# Patient Record
Sex: Female | Born: 1980 | Race: White | Marital: Single | Smoking: Never smoker
Health system: Southern US, Community
[De-identification: ages and names within clinical notes are randomized; demographics above are authoritative.]

## PROBLEM LIST (undated history)

## (undated) DIAGNOSIS — F419 Anxiety disorder, unspecified: Secondary | ICD-10-CM

## (undated) DIAGNOSIS — C73 Malignant neoplasm of thyroid gland: Secondary | ICD-10-CM

## (undated) DIAGNOSIS — F909 Attention-deficit hyperactivity disorder, unspecified type: Secondary | ICD-10-CM

## (undated) DIAGNOSIS — A6920 Lyme disease, unspecified: Secondary | ICD-10-CM

## (undated) DIAGNOSIS — F32A Depression, unspecified: Secondary | ICD-10-CM

## (undated) HISTORY — PX: THYROIDECTOMY: SHX17

## (undated) HISTORY — DX: Lyme disease, unspecified: A69.20

## (undated) HISTORY — DX: Anxiety disorder, unspecified: F41.9

## (undated) HISTORY — DX: Attention-deficit hyperactivity disorder, unspecified type: F90.9

## (undated) HISTORY — DX: Depression, unspecified: F32.A

## (undated) HISTORY — DX: Malignant neoplasm of thyroid gland: C73

---

## 2002-07-23 ENCOUNTER — Ambulatory Visit
Admission: AD | Admit: 2002-07-23 | Disposition: A | Discharge status: Home / Self Care | Payer: Self-pay | Source: Ambulatory Visit | Admitting: Surgery

## 2002-07-27 ENCOUNTER — Ambulatory Visit
Admission: AD | Admit: 2002-07-27 | Disposition: A | Discharge status: Home / Self Care | Payer: Self-pay | Source: Ambulatory Visit | Admitting: Family Medicine

## 2005-12-11 ENCOUNTER — Ambulatory Visit
Admission: RE | Admit: 2005-12-11 | Disposition: A | Discharge status: Home / Self Care | Payer: Self-pay | Source: Ambulatory Visit | Admitting: Family Medicine

## 2011-08-06 DIAGNOSIS — E209 Hypoparathyroidism, unspecified: Secondary | ICD-10-CM

## 2011-08-06 HISTORY — DX: Hypoparathyroidism, unspecified: E20.9

## 2011-08-06 HISTORY — DX: Hypocalcemia: E83.51

## 2013-04-02 ENCOUNTER — Emergency Department: Payer: Self-pay

## 2013-04-02 ENCOUNTER — Emergency Department
Admission: EM | Admit: 2013-04-02 | Discharge: 2013-04-02 | Disposition: A | Discharge status: Home / Self Care | Payer: Commercial Managed Care - PPO | Attending: Emergency Medicine | Admitting: Emergency Medicine

## 2013-04-02 DIAGNOSIS — R42 Dizziness and giddiness: Secondary | ICD-10-CM | POA: Insufficient documentation

## 2013-04-02 DIAGNOSIS — E039 Hypothyroidism, unspecified: Secondary | ICD-10-CM | POA: Insufficient documentation

## 2013-04-02 DIAGNOSIS — M62838 Other muscle spasm: Secondary | ICD-10-CM | POA: Insufficient documentation

## 2013-04-02 DIAGNOSIS — Z85850 Personal history of malignant neoplasm of thyroid: Secondary | ICD-10-CM | POA: Insufficient documentation

## 2013-04-02 LAB — CK: Creatine Kinase (CK): 100 U/L (ref 29–168)

## 2013-04-02 LAB — URINALYSIS
Bilirubin, UA: NEGATIVE
Glucose, UA: NEGATIVE
Ketones UA: NEGATIVE
Leukocyte Esterase, UA: NEGATIVE
Nitrite, UA: NEGATIVE
Protein, UR: NEGATIVE
Specific Gravity UA: 1.015 (ref 1.001–1.035)
Urine pH: 8 (ref 5.0–8.0)
Urobilinogen, UA: 0.2 mg/dL

## 2013-04-02 LAB — GFR: EGFR: >60

## 2013-04-02 LAB — RAPID DRUG SCREEN, URINE
Barbiturate Screen, UR: NEGATIVE
Benzodiazepine Screen, UR: NEGATIVE
Cannabinoid Screen, UR: NEGATIVE
Cocaine, UR: NEGATIVE
Opiate Screen, UR: NEGATIVE
PCP Screen, UR: NEGATIVE
Urine Amphetamine Screen: NEGATIVE

## 2013-04-02 LAB — COMPREHENSIVE METABOLIC PANEL
ALT: 14 U/L (ref 0–55)
AST (SGOT): 20 U/L (ref 5–34)
Albumin/Globulin Ratio: 1.1 (ref 0.9–2.2)
Albumin: 3.9 g/dL (ref 3.5–5.0)
Alkaline Phosphatase: 48 U/L (ref 40–150)
Anion Gap: 15 (ref 5.0–15.0)
BUN: 12 mg/dL (ref 7.0–19.0)
Bilirubin, Total: 0.4 mg/dL (ref 0.2–1.2)
CO2: 25 (ref 22–29)
Calcium: 8.1 mg/dL — ABNORMAL LOW (ref 8.5–10.5)
Chloride: 101 (ref 98–107)
Creatinine: 0.8 mg/dL (ref 0.6–1.0)
Globulin: 3.6 g/dL (ref 2.0–3.6)
Glucose: 78 mg/dL (ref 70–100)
Potassium: 3.9 (ref 3.5–5.1)
Protein, Total: 7.5 g/dL (ref 6.0–8.3)
Sodium: 141 (ref 136–145)

## 2013-04-02 LAB — CBC AND DIFFERENTIAL
Basophils Absolute Automated: 0.03 (ref 0.00–0.20)
Basophils Automated: 1 %
Eosinophils Absolute Automated: 0.03 (ref 0.00–0.70)
Eosinophils Automated: 1 %
Hematocrit: 36.8 % — ABNORMAL LOW (ref 37.0–47.0)
Hgb: 12.9 g/dL (ref 12.0–16.0)
Lymphocytes Absolute Automated: 1.28 (ref 0.50–4.40)
Lymphocytes Automated: 24 %
MCH: 32.2 pg — ABNORMAL HIGH (ref 28.0–32.0)
MCHC: 35.1 g/dL (ref 32.0–36.0)
MCV: 91.8 fL (ref 80.0–100.0)
MPV: 10.1 fL (ref 9.4–12.3)
Monocytes Absolute Automated: 0.33 (ref 0.00–1.20)
Monocytes: 6 %
Neutrophils Absolute: 3.6 (ref 1.80–8.10)
Neutrophils: 68 %
Platelets: 245 (ref 140–400)
RBC: 4.01 — ABNORMAL LOW (ref 4.20–5.40)
RDW: 12 % (ref 12–15)
WBC: 5.27 (ref 3.50–10.80)

## 2013-04-02 LAB — URINE MICROSCOPIC

## 2013-04-02 LAB — HCG, SERUM, QUALITATIVE: Hcg Qualitative: NEGATIVE

## 2013-04-02 LAB — MAGNESIUM: Magnesium: 1.6 mg/dL (ref 1.6–2.6)

## 2013-04-02 LAB — TROPONIN I: Troponin I: <0.01 ng/mL (ref 0.00–0.09)

## 2013-04-02 LAB — TSH: TSH: 12.06 — ABNORMAL HIGH (ref 0.35–4.94)

## 2013-04-02 MED ORDER — LORAZEPAM 1 MG PO TABS
0.5000 mg | ORAL_TABLET | Freq: Four times a day (QID) | ORAL | Status: DC | PRN
Start: 2013-04-02 — End: 2015-08-21

## 2013-04-02 MED ORDER — SODIUM CHLORIDE 0.9 % IV BOLUS
1000.0000 mL | Freq: Once | INTRAVENOUS | Status: AC
Start: 2013-04-02 — End: 2013-04-02
  Administered 2013-04-02: 1000 mL via INTRAVENOUS

## 2013-04-02 MED ORDER — LORAZEPAM 2 MG/ML IJ SOLN
0.5000 mg | Freq: Once | INTRAMUSCULAR | Status: AC
Start: 2013-04-02 — End: 2013-04-02
  Administered 2013-04-02: 0.5 mg via INTRAVENOUS
  Filled 2013-04-02: qty 1

## 2013-04-02 NOTE — Discharge Instructions (Signed)
Dizziness, Nonspecific    You have been seen for dizziness.     Dizziness can mean different things to different people. Some people use dizziness to mean the feeling of spinning when there is no actual movement. This often causes nausea (feeling sick). The medical term for this is "vertigo." Others people use the word dizzy to mean "feeling lightheaded," like you might faint. This feeling is usually made better when lying down. For some people, neither of these describes how they are feeling. It can just be a feeling that makes you unsteady. This feeling is common in older people. It can be caused by a number of things. These include poor vision or hearing, foot problems and arthritis. It can also be caused by middle ear or sinus problems. The feeling can come and go.    Dizziness is also caused by more serious things. This includes strokes and heart problems.    It is NEVER normal to have the kind of dizziness you have today together with:   Chest pain.   Problems walking because of problems with balance. Especially if you are falling to one side.   Weakness, numbness or tingling in a part of your body.   Drooping of one side of your face.   Confusion.   Severe headache.   Problems speaking.     If you have these symptoms, it is VERY IMPORTANT to go to the nearest emergency department.    Your tests today were negative (normal). This means we found no life-threatening causes for your dizziness. It is safe for you to go home.    See your primary care doctor for more work-up of your dizziness.     YOU SHOULD SEEK MEDICAL ATTENTION IMMEDIATELY, EITHER HERE OR AT THE NEAREST EMERGENCY DEPARTMENT, IF ANY OF THE FOLLOWING OCCUR:   You cannot speak clearly (slurring), one side of your face droops or you feel weak in the arms or legs (especially on one side).   You have problems with your balance.   You have problems hearing or there is ringing or a feeling of fullness in your ear.   You lose  consciousness ("pass out" or faint).   You have severe headache with dizziness.   You have fever greater than 100.79F (38C).   You fall and hit your head.      Hypocalcemia    You have been seen for Hypocalcemia.    Hypocalcemia means the level of calcium in the blood is low.     Some causes of hypocalcemia are:    You do not get enough calcium or vitamin D from your diet. (Vitamin D helps your body take calcium from the food you eat and use it to build bone.)    Your intestines are not absorbing calcium.    Your parathyroid gland does not make enough parathyroid hormone. This affects calcium levels in the blood.    You have kidney disease or your pancreas is inflamed.    You have low magnesium levels. Low magnesium can be caused by alcoholism. It can be caused by other diseases that change how your body absorbs nutrients.    -You take a medicine that changes the amount of calcium in your blood. For example, cimetidine, which lowers the amount of acid made by the stomach, lowers the amount of calcium in your blood. Some diuretics can also lower the amount of calcium and magnesium in your blood. Diuretics increase the flow of urine.    There  is too much phosphate in your blood. Many cola drinks have a lot of phosphate. Drinking these too much can lead to high phosphate and low calcium in the blood.     Some symptoms of severe hypocalcemia are:    Numbness or tingling around the mouth or in the feet and hands.    Muscle spasms in the face, feet and hands.    Depression.   Memory loss.   Hallucinations.     Your treatment will depend on the cause. Treatment is often an oral calcium supplement (replacement). Severe hypocalcemia is often treated with calcium gluconate. These are given as injections. You may get vitamin D through injections or as supplements. If you have low magnesium, your magnesium deficiency must be corrected to treat the hypocalcemia.    YOU SHOULD SEEK MEDICAL ATTENTION  IMMEDIATELY, EITHER HERE OR AT THE NEAREST EMERGENCY DEPARTMENT, IF ANY OF THE FOLLOWING OCCUR:   Shortness of breath, chest pain.   Confusion.   Hallucinations.   Weakness.   Muscle spasms.

## 2013-04-02 NOTE — ED Notes (Signed)
Pt arrived via EMS. Concerned her calcium levels may be low and that she might have a seizure due to low calcium levels.

## 2013-04-02 NOTE — ED Provider Notes (Addendum)
Physician/Midlevel provider first contact with patient: 04/02/13 1443         History     Chief Complaint   Patient presents with   . Abnormal Lab     HPI Comments: [name] presents w/ tingling in fingers, toes and lips. Also, having some dizziness. 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 She thinks Ca+ is low. Patient was breathing fast when this started.     Patient states she had a similar episode in [place] 2 days ago.    Breathing heavy makes it worse and rest improves sx.        The history is provided by the patient.       Past Medical History   Diagnosis Date   . Thyroid cancer        History reviewed. No pertinent past surgical history.    History reviewed. No pertinent family history.    Social  History   Substance Use Topics   . Smoking status: Not on file   . Smokeless tobacco: Not on file   . Alcohol Use: No       .     No Known Allergies    Current/Home Medications    CALCIUM PO    Take by mouth.    LEVOTHYROXINE SODIUM (SYNTHROID PO)    Take by mouth.        Review of Systems   Constitutional: Negative for fever and diaphoresis.   HENT: Negative for ear pain, congestion, sore throat and neck pain.    Eyes: Negative for pain and visual disturbance.   Respiratory: Negative for cough, chest tightness, shortness of breath and wheezing.    Cardiovascular: Negative for chest pain, palpitations and leg swelling.   Gastrointestinal: Negative for nausea, vomiting, abdominal pain, diarrhea and constipation.   Genitourinary: Negative for dysuria and urgency.   Musculoskeletal: Negative for back pain and gait problem.   Skin: Negative for color change and rash.   Neurological: Negative for dizziness, weakness and headaches.   Psychiatric/Behavioral: Negative for confusion. The patient is nervous/anxious.        Physical Exam    BP 127/89  Pulse 77  Temp 98.3 F (36.8 C)  Resp 18  SpO2 100%    Physical Exam   Nursing note and vitals reviewed.  Constitutional: She is oriented to person,  place, and time. She appears well-developed and well-nourished.   HENT:   Head: Normocephalic and atraumatic.   Right Ear: External ear normal.   Left Ear: External ear normal.   Mouth/Throat: Oropharynx is clear and moist. No oropharyngeal exudate.   Eyes: Conjunctivae normal are normal. Pupils are equal, round, and reactive to light. Right eye exhibits no discharge. Left eye exhibits no discharge. No scleral icterus.   Neck: Normal range of motion. Neck supple. No JVD present. No tracheal deviation present.   Cardiovascular: Normal rate, regular rhythm and normal heart sounds.    No murmur heard.  Pulmonary/Chest: Effort normal and breath sounds normal. She has no wheezes. She exhibits no tenderness.   Abdominal: Soft. Bowel sounds are normal. She exhibits no distension. There is no tenderness. There is no guarding.   Musculoskeletal: She exhibits no edema and no tenderness.   Neurological: She is alert and oriented to person, place, and time. Coordination normal.   Skin: Skin is warm and dry. She is not diaphoretic. No pallor.   Psychiatric: Her behavior is normal. Judgment normal. Her mood appears anxious.  MDM and ED Course     ED Medication Orders      Start     Status Ordering Provider    04/02/13 1446   sodium chloride 0.9 % bolus 1,000 mL   Once      Route: Intravenous  Ordered Dose: 1,000 mL         Last MAR action:  New Bag Jamie Dixon    04/02/13 1446   LORazepam (ATIVAN) injection 0.5 mg   Once      Route: Intravenous  Ordered Dose: 0.5 mg         Last MAR action:  Given Jamie Dixon                 MDM  Number of Diagnoses or Management Options  Dizziness:   Hypocalcemia:   Hypothyroid:   Muscle spasm:   Diagnosis management comments: Oxygen saturation by pulse oximetry is 95%-100%, Normal.  Interventions: None Needed.    Diff Dx: electrolyte disorder, dehydration, anxiety, hypocalcemia, hyper or hypothyroid. Doubt cardiac or PE  Oxygen saturation by pulse oximetry is 95%-100%, Normal.   Interventions: None Needed.    Attending Dr. Melvern Sample    Time [phone]  EKG Interpretation  EKG interpreted by ED physician  Rate: Normal for age.  Rhythm: Normal sinus rhythm  Axis: Normal for age  PR, QRS and QT intervals:  normal for age and rate  ST Segments: No deviations suggestive of ischemia  Impression: Normal ECG with no evidence of ischemia.    Attending : Dr. Melvern Sample        Reassessment of Jamie Dixon feeling better. Symptoms are improved. Stable for discharge.     TSH - elevated. Will need to see own endocrinologist.    I have reviewed the nursing history.    DR. Melvern Sample  is the primary attending for this patient and has obtained and performed the history, PE, and medical decision making for this patient.      Results     Procedure Component Value Units Date/Time    TSH ([id number])  (Abnormal) Collected:04/02/13 1454    Specimen Information:Blood Updated:04/02/13 1544     Thyroid Stimulating Hormone 12.06 (H)     Troponin I ([id number]) Collected:04/02/13 1454    Specimen Information:Blood Updated:04/02/13 1524     Troponin I <0.01 ng/mL     Comprehensive metabolic panel ([id number])  (Abnormal) Collected:04/02/13   1454    Specimen Information:Blood Updated:04/02/13 1519     Glucose 78 mg/dL      BUN 60.7 mg/dL      Creatinine 0.8 mg/dL      Sodium 371      Potassium 3.9      Chloride 101      CO2 25      CALCIUM 8.1 (L) mg/dL      Protein, Total 7.5 g/dL      Albumin 3.9 g/dL      AST (SGOT) 20 U/L      ALT 14 U/L      Alkaline Phosphatase 48 U/L      Bilirubin, Total 0.4 mg/dL      Globulin 3.6 g/dL      Albumin/Globulin Ratio 1.1      Anion Gap 15.0     CK with MB if indicated ([id number]) Collected:04/02/13 1454    Specimen Information:Blood Updated:04/02/13 1519     Creatine Kinase (CK) 100 U/L     Magnesium ([id number]) Collected:04/02/13 1454    Specimen Information:Blood  Updated:04/02/13 1519     Magnesium 1.6 mg/dL     Urine Rapid Drug Screen ([id number]) Collected:04/02/13 1507    Specimen  Information:Urine Updated:04/02/13 1519     Amphetamine Screen, UR Negative      Barbiturate Screen, UR Negative      Benzodiazepine Screen, UR Negative      Cannabinoid Screen, UR Negative      Cocaine, UR Negative      Opiate Screen, UR Negative      PCP Screen, UR Negative     GFR ([id number]) Collected:04/02/13 1454     EGFR >60.0 Updated:04/02/13 1519    Urinalysis ([id number])  (Abnormal) Collected:04/02/13 1507    Specimen Information:Urine Updated:04/02/13 1519     Urine Type Clean Catch      Color, UA YELLOW      Clarity, UA CLEAR      Specific Gravity UA 1.015      Urine pH 8.0      Leukocyte Esterase, UA NEGATIVE      Nitrite, UA NEGATIVE      Protein, UA NEGATIVE      Glucose, UA NEGATIVE      Ketones UA NEGATIVE      Urobilinogen, UA 0.2 mg/dL      Bilirubin, UA NEGATIVE      Blood, UA TRACE-INTACT (A)     Microscopic, Urine ([id number])  (Abnormal) Collected:04/02/13 1507     RBC, UA 0 - 5 Updated:04/02/13 1519     WBC, UA 0 - 5      Squamous Epithelial Cells, Urine 11 - 25      Urine Bacteria Few (A)     Beta HCG, Qual, Serum ([id number]) Collected:04/02/13 1454    Specimen Information:Blood Updated:04/02/13 1510     Hcg Qualitative Negative     CBC with differential ([id number])  (Abnormal) Collected:04/02/13 1454    Specimen Information:Blood / Blood Updated:04/02/13 1500     WBC 5.27      RBC 4.01 (L)      Hgb 12.9 g/dL      Hematocrit 01.0 (L) %      MCV 91.8 fL      MCH 32.2 (H) pg      MCHC 35.1 g/dL      RDW 12 %      Platelets 245      MPV 10.1 fL      Neutrophils 68 %      Lymphocytes Automated 24 %      Monocytes 6 %      Eosinophils Automated 1 %      Basophils Automated 1 %      Immature Granulocyte Unmeasured %      Nucleated RBC Unmeasured      Neutrophils Absolute 3.60      Abs Lymph Automated 1.28      Abs Mono Automated 0.33      Abs Eos Automated 0.03      Absolute Baso Automated 0.03      Absolute Immature Granulocyte Unmeasured              Amount and/or Complexity of Data Reviewed  Clinical  lab tests: ordered and reviewed  Tests in the medicine section of CPT: ordered and reviewed          Procedures    Clinical Impression & Disposition     Clinical Impression  Final diagnoses:   Dizziness   Hypocalcemia   Muscle spasm   Hypothyroid  ED Disposition     Discharge Jamie Dixon discharge to home/self care.    Condition at disposition: Stable             New Prescriptions    LORAZEPAM (ATIVAN) 1 MG TABLET    Take 0.5 tablets (0.5 mg total) by mouth every 6 (six) hours as needed for Anxiety.               Melvern Sample, DO  04/02/13 2256    Melvern Sample, DO  04/03/13 [phone]

## 2013-04-04 LAB — ECG 12-LEAD
Atrial Rate: 69 {beats}/min
P Axis: 35 degrees
P-R Interval: 130 ms
Q-T Interval: 418 ms
QRS Duration: 104 ms
QTC Calculation (Bezet): 447 ms
R Axis: 44 degrees
T Axis: 54 degrees
Ventricular Rate: 69 {beats}/min

## 2014-03-26 ENCOUNTER — Emergency Department: Payer: Commercial Managed Care - POS

## 2014-03-26 ENCOUNTER — Emergency Department
Admission: EM | Admit: 2014-03-26 | Discharge: 2014-03-26 | Disposition: A | Discharge status: Home / Self Care | Payer: Commercial Managed Care - POS | Attending: Emergency Medicine | Admitting: Emergency Medicine

## 2014-03-26 DIAGNOSIS — R0602 Shortness of breath: Secondary | ICD-10-CM | POA: Insufficient documentation

## 2014-03-26 DIAGNOSIS — C73 Malignant neoplasm of thyroid gland: Secondary | ICD-10-CM | POA: Insufficient documentation

## 2014-03-26 LAB — CBC AND DIFFERENTIAL
Hematocrit: 33.3 % — ABNORMAL LOW (ref 37.0–47.0)
Hgb: 11.5 g/dL — ABNORMAL LOW (ref 12.0–16.0)
MCH: 31.3 pg (ref 28.0–32.0)
MCHC: 34.5 g/dL (ref 32.0–36.0)
MCV: 90.7 fL (ref 80.0–100.0)
MPV: 9.7 fL (ref 9.4–12.3)
Platelets: 245 10*3/uL (ref 140–400)
RBC: 3.67 10*6/uL — ABNORMAL LOW (ref 4.20–5.40)
RDW: 12 % (ref 12–15)
WBC: 9.88 10*3/uL (ref 3.50–10.80)

## 2014-03-26 LAB — MAN DIFF ONLY
Band Neutrophils Absolute: 0.69 10*3/uL (ref 0.00–1.00)
Band Neutrophils: 7 %
Basophils Absolute Manual: 0.1 10*3/uL (ref 0.00–0.20)
Basophils Manual: 1 %
Eosinophils Absolute Manual: 0 10*3/uL (ref 0.00–0.70)
Eosinophils Manual: 0 %
Lymphocytes Absolute Manual: 2.17 10*3/uL (ref 0.50–4.40)
Lymphocytes Manual: 22 %
Monocytes Absolute: 0 10*3/uL (ref 0.00–1.20)
Monocytes Manual: 0 %
Neutrophils Absolute Manual: 6.92 10*3/uL (ref 1.80–8.10)
Nucleated RBC: 0 /100 WBC (ref 0–1)
Segmented Neutrophils: 70 %

## 2014-03-26 LAB — COMPREHENSIVE METABOLIC PANEL
ALT: 15 U/L (ref 0–55)
AST (SGOT): 24 U/L (ref 5–34)
Albumin/Globulin Ratio: 1.3 (ref 0.9–2.2)
Albumin: 4.5 g/dL (ref 3.5–5.0)
Alkaline Phosphatase: 52 U/L (ref 37–106)
Anion Gap: 10 (ref 5.0–15.0)
BUN: 12 mg/dL (ref 7–19)
Bilirubin, Total: 0.4 mg/dL (ref 0.2–1.2)
CO2: 27 mEq/L (ref 22–29)
Calcium: 10 mg/dL (ref 8.5–10.5)
Chloride: 103 mEq/L (ref 100–111)
Creatinine: 1 mg/dL (ref 0.6–1.0)
Globulin: 3.4 g/dL (ref 2.0–3.6)
Glucose: 100 mg/dL (ref 70–100)
Potassium: 3.8 mEq/L (ref 3.5–5.1)
Protein, Total: 7.9 g/dL (ref 6.0–8.3)
Sodium: 140 mEq/L (ref 136–145)

## 2014-03-26 LAB — HCG, SERUM, QUALITATIVE: Hcg Qualitative: NEGATIVE

## 2014-03-26 LAB — CELL MORPHOLOGY
Cell Morphology: NORMAL
Platelet Estimate: ADEQUATE

## 2014-03-26 LAB — GFR: EGFR: >60

## 2014-03-26 LAB — IHS D-DIMER: D-Dimer: 0.7 ug/mL FEU — ABNORMAL HIGH (ref 0.00–0.51)

## 2014-03-26 MED ORDER — SODIUM CHLORIDE 0.9 % IV BOLUS
1000.0000 mL | Freq: Once | INTRAVENOUS | Status: AC
Start: 2014-03-26 — End: 2014-03-26
  Administered 2014-03-26: 1000 mL via INTRAVENOUS

## 2014-03-26 NOTE — ED Provider Notes (Signed)
Physician/Midlevel provider first contact with patient: 03/26/14 1607         EMERGENCY DEPARTMENT HISTORY AND PHYSICAL EXAM    Patient Name: Jamie Dixon, Jamie Dixon  Patient DOB:  [birth year]  MRN:  [id number]  Room:  01B/A01B  Rendering Provider: Tresea Mall, PA-C    History of Presenting Illness     Chief Complaint:   Chief Complaint   Patient presents with   . Shortness of Breath     Mechanism of Injury:       Historian:  Patient   Onset:   3 days   Severity:  Mild      33 y.o. female h/o thyroid ca, hypocalcemia, lyme disease presents with sob and palpitations. Pt states she intermittently has similar episodes which are usually caused by hypo or hypercalcemia. Pt takes calcium supplements and concerned she is taking too much. 3 days ago pt had onset of palpitations, described as fast heart rate for a minute and then resolves along with dizziness and sob. Pt reports similar symptoms in the past. Pt also c/o anxiety. No fever, cough or chest pain. No recent travel. No leg pain or edema.     PMD:  Pcp, Noneorunknown, MD    Past Medical History     Past Medical History   Diagnosis Date   . Thyroid cancer    . Acute Lyme disease        Past Surgical History     History reviewed. No pertinent past surgical history.    Family History     History reviewed. No pertinent family history.    Social History     History     Social History   . Marital Status: Single     Spouse Name: N/A     Number of Children: N/A   . Years of Education: N/A     Social History Main Topics   . Smoking status: Never Smoker    . Smokeless tobacco: Not on file   . Alcohol Use: No      Comment: social drinker   . Drug Use: No   . Sexual Activity: Not on file     Other Topics Concern   . Not on file     Social History Narrative       Allergies     No Known Allergies    Home Medications     Home medications reviewed by PA at 5:20 PM    No current facility-administered medications for this encounter.  Current outpatient prescriptions: CALCIUM PO, Take by  mouth., Disp: , Rfl: ;  Levothyroxine Sodium (SYNTHROID PO), Take by mouth., Disp: , Rfl: ;  LORazepam (ATIVAN) 1 MG tablet, Take 0.5 tablets (0.5 mg total) by mouth every 6 (six) hours as needed for Anxiety., Disp: 20 tablet, Rfl: 0    ED Medications Administered     ED Medication Orders     Start     Status Ordering Provider    03/26/14 1617  sodium chloride 0.9 % bolus 1,000 mL   Once     Route: Intravenous  Ordered Dose: 1,000 mL     Last MAR action:  Stopped Jaloni Sorber SIMONS          Review of Systems     Constitutional: Negative for fever and chills.   Respiratory: Positive for shortness of breath.   Cardiovascular: Positive for palpitations. Negative for chest pain.   Gastrointestinal: Negative for abdominal pain, nausea, vomiting  Skin: Negative for rash.  Psych: Positive for anxiety.   Neurological: Negative for headache or dizziness.   All other systems reviewed and negative     VS     Patient Vitals for the past 24 hrs:   BP Temp Pulse Resp SpO2 Height Weight   03/26/14 1814 106/63 mmHg - 77 18 98 % - -   03/26/14 1558 139/66 mmHg 98.2 F (36.8 C) (!) 106 16 99 % 1.702 m 54.432 kg       Physical Exam     Constitutional: Vital signs reviewed. Well appearing, appears anxious.  Head: Normocephalic, atraumatic  Eyes: Conjunctiva and sclera are normal.  No injection or discharge.  Neck: Normal range of motion. Trachea midline.  Respiratory/Chest: Clear to auscultation. No respiratory distress.   Cardiovascular: Regular rate and rhythm. No murmurs.   Abdomen: Soft and non-tender. No masses.  No rebound or guarding.  Upper Extremity:  No edema. No cyanosis.  Lower Extremity:  No edema. No cyanosis. No calf tenderness bilaterally.   Neurological:  Alert and conversant.  No focal motor deficits by observation. Speech normal.  Skin: Warm and dry. No rash.  Psychiatric:  Normal affect.  Normal insight.     Data Review     Nursing notes reviewed and agree: Yes  Radiologic study results reviewed by ED provider:   Yes  Laboratory results reviewed by ED provider:  Yes     Diagnostic Study Results     Labs:     Results     Procedure Component Value Units Date/Time    CBC and differential [[id number]]  (Abnormal) Collected:  03/26/14 1617    Specimen Information:  Blood / Blood Updated:  03/26/14 1659     WBC 9.88 x10 3/uL      RBC 3.67 (L) x10 6/uL      Hgb 11.5 (L) g/dL      Hematocrit 86.5 (L) %      MCV 90.7 fL      MCH 31.3 pg      MCHC 34.5 g/dL      RDW 12 %      Platelets 245 x10 3/uL      MPV 9.7 fL     Manual Differential [[id number]] Collected:  03/26/14 1617     Segmented Neutrophils 70 % Updated:  03/26/14 1659     Band Neutrophils 7 %      Lymphocytes Manual 22 %      Monocytes Manual 0 %      Eosinophils Manual 0 %      Basophils Manual 1 %      Nucleated RBC 0 /100 WBC      Abs Seg Manual 6.92 x10 3/uL      Bands Absolute 0.69 x10 3/uL      Absolute Lymph Manual 2.17 x10 3/uL      Monocytes Absolute 0.00 x10 3/uL      Absolute Eos Manual 0.00 x10 3/uL      Absolute Baso Manual 0.10 x10 3/uL     Cell MorpHology [[id number]] Collected:  03/26/14 1617     Cell Morphology: Normal Updated:  03/26/14 1659     Platelet Estimate Adequate     Comprehensive metabolic panel [[id number]] Collected:  03/26/14 1617    Specimen Information:  Blood Updated:  03/26/14 1643     Glucose 100 mg/dL      BUN 12 mg/dL      Creatinine 1.0 mg/dL      Sodium 644 mEq/L  Potassium 3.8 mEq/L      Chloride 103 mEq/L      CO2 27 mEq/L      CALCIUM 10.0 mg/dL      Protein, Total 7.9 g/dL      Albumin 4.5 g/dL      AST (SGOT) 24 U/L      ALT 15 U/L      Alkaline Phosphatase 52 U/L      Bilirubin, Total 0.4 mg/dL      Globulin 3.4 g/dL      Albumin/Globulin Ratio 1.3      Anion Gap 10.0     GFR [[id number]] Collected:  03/26/14 1617     EGFR >60.0 Updated:  03/26/14 1643    D-Dimer [[id number]]  (Abnormal) Collected:  03/26/14 1617     D-Dimer 0.70 (H) ug/mL FEU Updated:  03/26/14 1637    hCG, Qualitative (Pos/Neg) [[id number]] Collected:  03/26/14 1617     Specimen Information:  Blood Updated:  03/26/14 1632     Hcg Qualitative Negative           Radiologic Studies:  Radiology Results (24 Hour)     Procedure Component Value Units Date/Time    CT Angio Chest (PE study) [[id number]] Collected:  03/26/14 1757    Order Status:  Completed Updated:  03/26/14 1802    Narrative:      HISTORY: Shortness of breath and palpitations    TECHNIQUE: Helical CTA was performed of pulmonary arteries following the  administration of IV contrast according to the ACR protocol.  Patient  was administered 100cc non-ionic IV contrast.  MIP reconstructions were  performed of the pulmonary arteries.    CTA CHEST WITH CONTRAST:   No thrombus is seen within the pulmonary arteries out to the  subsegmental branches.  The heart is not enlarged.  There is no  significant axillary, mediastinal, or hilar lymphadenopathy.  Limited  views of lungs are clear.  Limited evaluation of the upper abdomen shows  no gross abnormality.      Impression:           No CT evidence of pulmonary embolism.    Kristine Linea, MD   03/26/2014 5:58 PM        .    EKG:      Monitor:      Procedures         MDM and Clinical Notes     17:23: Pt signed out to Dr. Brooke Dare pending CT chest.     Diagnosis and Disposition     Clinical Impression  1. Shortness of breath        Disposition  ED Disposition     Discharge Jaqulyn Chancellor discharge to home/self care.    Condition at disposition: Stable            Prescriptions    Discharge Medication List as of 03/26/2014  6:04 PM            Roselee Culver, PA  03/27/14 [phone]

## 2014-03-26 NOTE — Discharge Instructions (Signed)
Shortness of Breath, Unclear Etiology    You have been seen today for shortness of breath, or difficulty breathing; but we don't know the cause.    This means that after talking about your medical problems, doing a physical exam, and looking at the tests that were done, there is still not a clear explanation as to why you are short of breath. You may need to go see your doctor for another exam or more tests to find out why you are short of breath. .    CAUTION: Even after a workup, including EKGs, lab tests, x-rays and even CAT Scans (CT scans), it is still possible to have a serious problem that cannot be detected at first.    The doctor caring for you feels that the cause of your shortness of breath is not from a life-threatening problem.    Some of the more dangerous medical problems such as a heart attack, blood clot in the lung (pulmonary embolism), asthma, collapsed lung, heart failure, etc. have been looked at, but are not felt to be the cause of your shortness of breath.    Shortness of breath is a serious symptom and must be handled carefully. It is VERY IMPORTANT that you see your regular doctor and return for re-evaluation or seek medical attention immediately if your symptoms become worse or change.    YOU SHOULD SEEK MEDICAL ATTENTION IMMEDIATELY, EITHER HERE OR AT THE NEAREST EMERGENCY DEPARTMENT, IF ANY OF THE FOLLOWING OCCURS:   Your shortness of breath returns.   You notice that your shortness of breath happens as you walk, go up stairs, or exert yourself.   If you develop chest pain, sweating, or nausea (feel sick to your stomach).   Rapid heartbeat.   You have any weakness, lightheadedness or you get so short of breath that you pass out.   It hurts to breathe.   Your leg swells.   Any other worsening symptoms or problems.   If you are unable to sleep because of shortness of breath, or you wake in the middle of the night short of breath.    Thank you for choosing [facility] for your emergency care needs.  We strive to provide EXCELLENT care to you and your family.      If you do not continue to improve or your condition worsens, please contact your doctor or return immediately to the Emergency Department.    ONSITE PHARMACY  Our full service onsite pharmacy is a 2 minute walk from the ER.  Open Mon to Fri from 8 am to 8 pm, Sat and Sun 9 am to 5 pm. Ask an ED staff member for directions.  We accept all major insurances and prices are competitive with major retailers.  Ask your provider to print your prescriptions down to the pharmacy to speed you on your way home.      DOCTOR REFERRALS  Call [phone] (available 24 hours a day, 7 days a week) if you need any further referrals and we can help you find a primary care doctor or specialist.  Also, available online at:  [email or web address]    YOUR CONTACT INFORMATION  Before leaving please check with registration to make sure we have an up-to-date contact number.  You can call registration at [phone] to update your information.  For questions about your hospital bill, please call [phone].  For questions about your Emergency Dept Physician bill please call [phone].        FREE HEALTH SERVICES  If you need help with health or social services, please call 2-1-1 for a free referral to resources in your area.  2-1-1 is a free service connecting people with information on health insurance, free clinics, pregnancy, mental health, dental care, food assistance, housing, and substance abuse counseling.  Also, available online at:  http://www.[facility].org    MEDICAL RECORDS AND TESTS  Certain laboratory test results do not come back the same day, for example urine cultures.   We will contact you if other important findings are noted.  Radiology films are often reviewed again to ensure accuracy.  If there is any discrepancy, we will notify you.      Please call [phone] to pick up a  complimentary CD of any radiology studies performed.  If you or your doctor would like to request a copy of your medical records, please call [phone].          ORTHOPEDIC INJURY   Please know that significant injuries can exist even when an initial x-ray is read as normal or negative.  This can occur because some fractures (broken bones) are not initially visible on x-rays.  For this reason, close outpatient follow-up with your primary care doctor or bone specialist (orthopedist) is required.    MEDICATIONS AND FOLLOWUP  Please be aware that some prescription medications can cause drowsiness.  Use caution when driving or operating machinery.    The examination and treatment you have received in our Emergency Department is provided on an emergency basis, and is not intended to be a substitute for your primary care physician.  It is important that your doctor checks you again and that you report any new or remaining problems at that time.      24 HOUR PHARMACIES  [facility] - [address], [place], [place] [address] (1.4 miles, 7 minutes)  [facility] - [address], [place], [place] [address] (6.5 miles, 13 minutes)  Handout with directions available on request      ASSISTANCE WITH INSURANCE    Affordable Care Act  (ACA)  Call to start or finish an application, compare plans, enroll or ask a question.  1-800-318-2596  TTY: 1-855-889-4325  Web:  Healthcare.gov    Help Enrolling in Medicaid  [facility] [place]  (855) 242-8282 (TOLL-FREE)  (888) 221-1590 (TTY)  Web:  Http://www.coverva.org    Local Help Enrolling in the ACA  [facility]  (571) 748-2580 (MAIN)  Email:  health-help@nvfs.org  Web:  Http://www.nvfs.org  Address:  [address], [address] [place], [place] [address]

## 2014-03-26 NOTE — ED Notes (Signed)
Pt to ED with c/o's of intermittent chest palpitations and shortness of breath. Symptoms noted today. Pt concerned with calcium and thyroid issues. Pt appears anxious on arrival. No acute respiratory distress noted.

## 2014-03-28 ENCOUNTER — Emergency Department: Payer: Commercial Managed Care - POS

## 2014-03-28 ENCOUNTER — Emergency Department
Admission: EM | Admit: 2014-03-28 | Discharge: 2014-03-28 | Disposition: A | Discharge status: Home / Self Care | Payer: Commercial Managed Care - POS | Attending: Emergency Medicine | Admitting: Emergency Medicine

## 2014-03-28 DIAGNOSIS — Z85850 Personal history of malignant neoplasm of thyroid: Secondary | ICD-10-CM | POA: Insufficient documentation

## 2014-03-28 DIAGNOSIS — E890 Postprocedural hypothyroidism: Secondary | ICD-10-CM

## 2014-03-28 DIAGNOSIS — E039 Hypothyroidism, unspecified: Secondary | ICD-10-CM

## 2014-03-28 DIAGNOSIS — D649 Anemia, unspecified: Secondary | ICD-10-CM | POA: Insufficient documentation

## 2014-03-28 DIAGNOSIS — R0602 Shortness of breath: Secondary | ICD-10-CM | POA: Insufficient documentation

## 2014-03-28 DIAGNOSIS — R748 Abnormal levels of other serum enzymes: Secondary | ICD-10-CM | POA: Insufficient documentation

## 2014-03-28 LAB — COMPREHENSIVE METABOLIC PANEL
ALT: 21 U/L (ref 0–55)
AST (SGOT): 35 U/L — ABNORMAL HIGH (ref 5–34)
Albumin/Globulin Ratio: 1.4 (ref 0.9–2.2)
Albumin: 4.5 g/dL (ref 3.5–5.0)
Alkaline Phosphatase: 60 U/L (ref 37–106)
Anion Gap: 17 — ABNORMAL HIGH (ref 5.0–15.0)
BUN: 11 mg/dL (ref 7.0–19.0)
Bilirubin, Total: 0.4 mg/dL (ref 0.2–1.2)
CO2: 22 mEq/L (ref 22–29)
Calcium: 10.1 mg/dL (ref 8.5–10.5)
Chloride: 100 mEq/L (ref 100–111)
Creatinine: 0.9 mg/dL (ref 0.6–1.0)
Globulin: 3.3 g/dL (ref 2.0–3.6)
Glucose: 77 mg/dL (ref 70–100)
Potassium: 3.6 mEq/L (ref 3.5–5.1)
Protein, Total: 7.8 g/dL (ref 6.0–8.3)
Sodium: 139 mEq/L (ref 136–145)

## 2014-03-28 LAB — CBC AND DIFFERENTIAL
Basophils Absolute Automated: 0.03 10*3/uL (ref 0.00–0.20)
Basophils Automated: 0 %
Eosinophils Absolute Automated: 0.01 10*3/uL (ref 0.00–0.70)
Eosinophils Automated: 0 %
Hematocrit: 33.7 % — ABNORMAL LOW (ref 37.0–47.0)
Hgb: 11.7 g/dL — ABNORMAL LOW (ref 12.0–16.0)
Lymphocytes Absolute Automated: 1.67 10*3/uL (ref 0.50–4.40)
Lymphocytes Automated: 21 %
MCH: 31.6 pg (ref 28.0–32.0)
MCHC: 34.7 g/dL (ref 32.0–36.0)
MCV: 91.1 fL (ref 80.0–100.0)
MPV: 9.9 fL (ref 9.4–12.3)
Monocytes Absolute Automated: 0.31 10*3/uL (ref 0.00–1.20)
Monocytes: 4 %
Neutrophils Absolute: 5.92 10*3/uL (ref 1.80–8.10)
Neutrophils: 75 %
Platelets: 246 10*3/uL (ref 140–400)
RBC: 3.7 10*6/uL — ABNORMAL LOW (ref 4.20–5.40)
RDW: 12 % (ref 12–15)
WBC: 7.94 10*3/uL (ref 3.50–10.80)

## 2014-03-28 LAB — GFR: EGFR: >60

## 2014-03-28 LAB — ECG 12-LEAD
Atrial Rate: 93 {beats}/min
P Axis: 82 degrees
P-R Interval: 152 ms
Q-T Interval: 346 ms
QRS Duration: 90 ms
QTC Calculation (Bezet): 430 ms
R Axis: 67 degrees
T Axis: 74 degrees
Ventricular Rate: 93 {beats}/min

## 2014-03-28 LAB — TSH: TSH: 8.26 u[IU]/mL — ABNORMAL HIGH (ref 0.35–4.94)

## 2014-03-28 LAB — HCG, SERUM, QUALITATIVE: Hcg Qualitative: NEGATIVE

## 2014-03-28 NOTE — Discharge Instructions (Signed)
Abnormal Laboratory Result    During your visit today, one of your tests was abnormal.    Abnormal laboratory results are common. Most of the time an incidental abnormal laboratory result is nothing serious. Your doctor today doesn t think anything needs to be done about your laboratory result right away. You might need another exam or more tests to find out why you have this abnormal lab result. At this time, the cause of laboratory results does not seem dangerous. You do not need to stay in the hospital.    We don't believe your condition is dangerous right now. However, you need to be careful. Sometimes a problem that seems small can get serious later. This is why it is very important to come back here or go to the nearest Emergency Department unless you are 100% improved.    In rare cases an abnormal laboratory result can be a sign of something serious or that you are developing an illness. This is why it s important for your primary doctor to keep a close eye on you. They will need to recheck your abnormal laboratory result.    YOU SHOULD SEEK MEDICAL ATTENTION IMMEDIATELY, EITHER HERE OR AT THE NEAREST EMERGENCY DEPARTMENT, IF ANY OF THE FOLLOWING OCCURS:     You can t get your laboratory result rechecked in the time period your doctor recommended today.   You have a fever (temperature higher than 100.4F or 38C).    You get any other symptoms, concerns, or don t get better as expected.   You get worse or feel you can t wait until your follow-up appointment to get treated.    If you can t follow up with your doctor, or if at any time you feel you need to be rechecked or seen again, come back here or go to the nearest emergency department.              Shortness of Breath, Unclear Etiology    You have been seen today for shortness of breath, or difficulty breathing; but we don't know the cause.    This means that after talking about your medical problems, doing a physical exam, and looking at the  tests that were done, there is still not a clear explanation as to why you are short of breath. You may need to go see your doctor for another exam or more tests to find out why you are short of breath. .    CAUTION: Even after a workup, including EKGs, lab tests, x-rays and even CAT Scans (CT scans), it is still possible to have a serious problem that cannot be detected at first.    The doctor caring for you feels that the cause of your shortness of breath is not from a life-threatening problem.    Some of the more dangerous medical problems such as a heart attack, blood clot in the lung (pulmonary embolism), asthma, collapsed lung, heart failure, etc. have been looked at, but are not felt to be the cause of your shortness of breath.    Shortness of breath is a serious symptom and must be handled carefully. It is VERY IMPORTANT that you see your regular doctor and return for re-evaluation or seek medical attention immediately if your symptoms become worse or change.    YOU SHOULD SEEK MEDICAL ATTENTION IMMEDIATELY, EITHER HERE OR AT THE NEAREST EMERGENCY DEPARTMENT, IF ANY OF THE FOLLOWING OCCURS:   Your shortness of breath returns.   You notice that   your shortness of breath happens as you walk, go up stairs, or exert yourself.   If you develop chest pain, sweating, or nausea (feel sick to your stomach).   Rapid heartbeat.   You have any weakness, lightheadedness or you get so short of breath that you pass out.   It hurts to breathe.   Your leg swells.   Any other worsening symptoms or problems.   If you are unable to sleep because of shortness of breath, or you wake in the middle of the night short of breath.

## 2014-03-28 NOTE — ED Provider Notes (Signed)
Physician/Midlevel provider first contact with patient: 03/28/14 1335         History     Chief Complaint   Patient presents with   . Shortness of Breath     HPI Comments: Pt is a 33 yo female with hx of thyroid Ca, thyroidectomy, hypothyroidism, hx of hypocalcemia, anxiety, comes to ER for evaluation of sob. Intermittent. No change with rest or activity. Seen at [facility] on 8/22 for same, CTa neg for PE, labs unremarkable. Pt comes today re-evaluation. States she does not feel better.   No cp. No cough. No abd pain. No recent changes in medications. No recent long plane/car travels.   States she has been under stress lately. Was drinking last night with her friend as well.         The history is provided by the patient.         Past Medical History   Diagnosis Date   . Thyroid cancer    . Acute Lyme disease        Past Surgical History   Procedure Laterality Date   . Thyroidectomy         No family history on file.    Social  History   Substance Use Topics   . Smoking status: Never Smoker    . Smokeless tobacco: Not on file   . Alcohol Use: No      Comment: social drinker       .     No Known Allergies    Discharge Medication List as of 03/28/2014  3:19 PM      CONTINUE these medications which have NOT CHANGED    Details   CALCIUM PO Take by mouth., Until Discontinued, Historical Med      Levothyroxine Sodium (SYNTHROID PO) Take by mouth., Until Discontinued, Historical Med      LORazepam (ATIVAN) 1 MG tablet Take 0.5 tablets (0.5 mg total) by mouth every 6 (six) hours as needed for Anxiety., Starting 04/02/2013, Until Discontinued, Print              Review of Systems   Constitutional: Negative for fever and chills.   HENT: Negative for sore throat.    Eyes: Negative.    Respiratory: Positive for shortness of breath. Negative for cough.    Gastrointestinal: Negative for nausea, vomiting and abdominal pain.   Endocrine: Negative.    Genitourinary: Negative.    Musculoskeletal: Negative for back pain.   Skin:  Negative for rash.   Allergic/Immunologic: Negative.    Neurological: Negative for weakness and numbness.   Psychiatric/Behavioral: The patient is nervous/anxious.    All other systems reviewed and are negative.      Physical Exam    BP: 136/90 mmHg, Heart Rate: 77, Temp: 98.3 F (36.8 C), Resp Rate: 20, SpO2: 100 %, Weight: 54 kg    Physical Exam   Constitutional: She is oriented to person, place, and time. She appears well-developed and well-nourished. No distress.   HENT:   Head: Normocephalic and atraumatic.   Right Ear: External ear normal.   Left Ear: External ear normal.   Nose: Nose normal.   Mouth/Throat: Oropharynx is clear and moist.   Eyes: Conjunctivae and EOM are normal. Pupils are equal, round, and reactive to light.   Neck: Normal range of motion. Neck supple.   Cardiovascular: Normal rate, regular rhythm, normal heart sounds and intact distal pulses.    Pulmonary/Chest: Effort normal and breath sounds normal.   Abdominal:  Soft. Bowel sounds are normal. There is no tenderness.   Musculoskeletal: Normal range of motion. She exhibits no edema or tenderness.   Neurological: She is alert and oriented to person, place, and time. No cranial nerve deficit. Coordination normal.   Skin: Skin is warm and dry. She is not diaphoretic.   Psychiatric: Judgment and thought content normal.   Appears anxious   Nursing note and vitals reviewed.      MDM and ED Course     ED Medication Orders     None           MDM  Number of Diagnoses or Management Options  Anemia, unspecified anemia type:   Elevated liver enzymes:   Hypothyroidism, unspecified hypothyroidism type:   SOB (shortness of breath):   Diagnosis management comments: The attending signature signifies review and agreement of the history, physical examination, evaluation, clinical impression and plan except as noted.     I, Phyllis Ginger PA-C, have been the primary provider for Jamie Dixon during this Emergency Dept visit.    Oxygen saturation by pulse  oximetry is 95%-100%, Normal.  Interventions: None Needed.    EKG Interpretation    Rate: Normal  Rhythm: sinus rhythm  Axis: Normal  ST-T Segments: no ST elevation   Conduction: No blocks  Impression: Normal EKG    No change from previous EKG on record.    3:19 PM  Pt will be discharged home, advised to follow up with PCP.  Elevated liver enzyme and elevated anion gap, pt states she was drinking last night with her friend.   Lab results reviewed with pt and all questions answered. We will [place] home. Advised to come back to ER if feeling worse.  Pt agrees with plan.            Amount and/or Complexity of Data Reviewed  Clinical lab tests: ordered and reviewed          Procedures    Clinical Impression & Disposition     Clinical Impression  Final diagnoses:   SOB (shortness of breath)   Elevated liver enzymes   Anemia, unspecified anemia type   Hypothyroidism, unspecified hypothyroidism type        ED Disposition     Discharge Jamie Dixon discharge to home/self care.    Condition at disposition: Stable             Discharge Medication List as of 03/28/2014  3:19 PM                    Paula Compton, PA  03/29/14 1412    Erling Conte, MD  03/29/14 2116

## 2014-03-28 NOTE — ED Notes (Signed)
C/o shortness of breath- 3rd visit to er in one week for same

## 2014-03-28 NOTE — ED Notes (Signed)
Patient with history of thyroid cancer status post thyroidectomy.  She treats with Synthroid and calcium supplements.  She presents with intermittent shortness of breath several days.  She believes signs and symptoms are related to fluctuations in calcium.  She was evaluated in the [facility] emergency department 2 days ago and had normal evaluation including CTA chest to rule out PE, electrolytes, calcium, cell counts.  She is in no apparent distress.  Vital signs are normal.  EKG shows normal sinus rhythm without evidence of ischemia or arrhythmia.  We will check labs including electrolytes, calcium, thyroid-stimulating hormone, and monitor.  Anticipate discharge with outpatient follow-up.  Patient has been in contact with her endocrinologist for follow-up.    I, Djimon Lundstrom C. Kizzie Bane, MD, have personally seen and examined this patient, and have fully participated in her care.  I agree with all pertinent and available clinical information, including history, physical examination, assessment, clinical impression, and plan as documented by the [facility] except as noted.          Erling Conte, MD  03/29/14 2117

## 2014-03-29 LAB — ECG 12-LEAD
Atrial Rate: 70 {beats}/min
P Axis: 68 degrees
P-R Interval: 136 ms
Q-T Interval: 414 ms
QRS Duration: 102 ms
QTC Calculation (Bezet): 447 ms
R Axis: 57 degrees
T Axis: 67 degrees
Ventricular Rate: 70 {beats}/min

## 2015-08-16 ENCOUNTER — Emergency Department
Admission: EM | Admit: 2015-08-16 | Discharge: 2015-08-17 | Disposition: A | Discharge status: Other Acute Inpatient Hospital | Payer: Commercial Managed Care - POS | Attending: Emergency Medicine | Admitting: Emergency Medicine

## 2015-08-16 ENCOUNTER — Emergency Department: Payer: Commercial Managed Care - POS

## 2015-08-16 ENCOUNTER — Telehealth (EMERGENCY_DEPARTMENT_HOSPITAL): Payer: Commercial Managed Care - POS

## 2015-08-16 DIAGNOSIS — F1020 Alcohol dependence, uncomplicated: Secondary | ICD-10-CM

## 2015-08-16 DIAGNOSIS — E872 Acidosis, unspecified: Secondary | ICD-10-CM

## 2015-08-16 DIAGNOSIS — R45851 Suicidal ideations: Secondary | ICD-10-CM | POA: Insufficient documentation

## 2015-08-16 DIAGNOSIS — E860 Dehydration: Secondary | ICD-10-CM | POA: Insufficient documentation

## 2015-08-16 DIAGNOSIS — R748 Abnormal levels of other serum enzymes: Secondary | ICD-10-CM | POA: Insufficient documentation

## 2015-08-16 DIAGNOSIS — F10120 Alcohol abuse with intoxication, uncomplicated: Secondary | ICD-10-CM | POA: Insufficient documentation

## 2015-08-16 DIAGNOSIS — Z85850 Personal history of malignant neoplasm of thyroid: Secondary | ICD-10-CM | POA: Insufficient documentation

## 2015-08-16 DIAGNOSIS — F10920 Alcohol use, unspecified with intoxication, uncomplicated: Secondary | ICD-10-CM

## 2015-08-16 DIAGNOSIS — N390 Urinary tract infection, site not specified: Secondary | ICD-10-CM | POA: Insufficient documentation

## 2015-08-16 DIAGNOSIS — E890 Postprocedural hypothyroidism: Secondary | ICD-10-CM | POA: Insufficient documentation

## 2015-08-16 LAB — COMPREHENSIVE METABOLIC PANEL
ALT: 66 U/L — ABNORMAL HIGH (ref 0–55)
AST (SGOT): 96 U/L — ABNORMAL HIGH (ref 5–34)
Albumin/Globulin Ratio: 1.1 (ref 0.9–2.2)
Albumin: 4.4 g/dL (ref 3.5–5.0)
Alkaline Phosphatase: 73 U/L (ref 37–106)
Anion Gap: 19 — ABNORMAL HIGH (ref 5.0–15.0)
BUN: 12.8 mg/dL (ref 7.0–19.0)
Bilirubin, Total: 0.4 mg/dL (ref 0.2–1.2)
CO2: 21 mEq/L — ABNORMAL LOW (ref 22–29)
Calcium: 7.9 mg/dL — ABNORMAL LOW (ref 8.5–10.5)
Chloride: 104 mEq/L (ref 100–111)
Creatinine: 0.8 mg/dL (ref 0.6–1.0)
Globulin: 4.1 g/dL — ABNORMAL HIGH (ref 2.0–3.6)
Glucose: 88 mg/dL (ref 70–100)
Potassium: 4.2 mEq/L (ref 3.5–5.1)
Protein, Total: 8.5 g/dL — ABNORMAL HIGH (ref 6.0–8.3)
Sodium: 144 mEq/L (ref 136–145)

## 2015-08-16 LAB — PHOSPHORUS: Phosphorus: 5.7 mg/dL — ABNORMAL HIGH (ref 2.3–4.7)

## 2015-08-16 LAB — CBC
Hematocrit: 40.6 % (ref 37.0–47.0)
Hgb: 13.6 g/dL (ref 12.0–16.0)
MCH: 32.9 pg — ABNORMAL HIGH (ref 28.0–32.0)
MCHC: 33.5 g/dL (ref 32.0–36.0)
MCV: 98.1 fL (ref 80.0–100.0)
MPV: 9.4 fL (ref 9.4–12.3)
Platelets: 238 10*3/uL (ref 140–400)
RBC: 4.14 10*6/uL — ABNORMAL LOW (ref 4.20–5.40)
RDW: 13 % (ref 12–15)
WBC: 5.42 10*3/uL (ref 3.50–10.80)

## 2015-08-16 LAB — URINALYSIS, REFLEX TO MICROSCOPIC EXAM IF INDICATED
Bilirubin, UA: NEGATIVE
Blood, UA: NEGATIVE
Glucose, UA: NEGATIVE
Ketones UA: 5 — AB
Nitrite, UA: POSITIVE — AB
Protein, UR: NEGATIVE
Specific Gravity UA: 1.008 (ref 1.001–1.035)
Urine pH: 7 (ref 5.0–8.0)
Urobilinogen, UA: NORMAL mg/dL

## 2015-08-16 LAB — RAPID DRUG SCREEN, URINE
Barbiturate Screen, UR: NEGATIVE
Benzodiazepine Screen, UR: NEGATIVE
Cannabinoid Screen, UR: NEGATIVE
Cocaine, UR: NEGATIVE
Opiate Screen, UR: NEGATIVE
PCP Screen, UR: NEGATIVE
Urine Amphetamine Screen: NEGATIVE

## 2015-08-16 LAB — ACETAMINOPHEN LEVEL: Acetaminophen Level: <7 ug/mL — ABNORMAL LOW (ref 10–30)

## 2015-08-16 LAB — MAGNESIUM: Magnesium: 1.6 mg/dL (ref 1.6–2.6)

## 2015-08-16 LAB — ETHANOL
Alcohol: 377 mg/dL — ABNORMAL HIGH
Alcohol: 278 mg/dL — ABNORMAL HIGH

## 2015-08-16 LAB — HCG, SERUM, QUALITATIVE: Hcg Qualitative: NEGATIVE

## 2015-08-16 LAB — GFR: EGFR: >60

## 2015-08-16 LAB — SALICYLATE LEVEL: Salicylate Level: <5 mg/dL — ABNORMAL LOW (ref 15.0–30.0)

## 2015-08-16 LAB — TSH: TSH: 1.68 u[IU]/mL (ref 0.35–4.94)

## 2015-08-16 LAB — LIPASE: Lipase: 42 U/L (ref 8–78)

## 2015-08-16 MED ORDER — CEPHALEXIN 500 MG PO CAPS
500.0000 mg | ORAL_CAPSULE | Freq: Two times a day (BID) | ORAL | Status: DC
Start: 2015-08-16 — End: 2015-08-21

## 2015-08-16 MED ORDER — THIAMINE HCL 100 MG/ML IJ SOLN
1000.0000 mL/h | Freq: Once | INTRAVENOUS | Status: AC
Start: 2015-08-16 — End: 2015-08-17
  Administered 2015-08-16: 1000 mL/h via INTRAVENOUS
  Filled 2015-08-16: qty 1000

## 2015-08-16 MED ORDER — SODIUM CHLORIDE 0.9 % IV BOLUS
1000.0000 mL | Freq: Once | INTRAVENOUS | Status: AC
Start: 2015-08-16 — End: 2015-08-16
  Administered 2015-08-16: 1000 mL via INTRAVENOUS

## 2015-08-16 MED ORDER — SODIUM CHLORIDE 0.9 % IV MBP
1.0000 g | Freq: Once | INTRAVENOUS | Status: AC
Start: 2015-08-16 — End: 2015-08-16
  Administered 2015-08-16: 1 g via INTRAVENOUS
  Filled 2015-08-16: qty 1000
  Filled 2015-08-16: qty 100
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
## 2015-08-16 NOTE — Discharge Instructions (Signed)
Abnormal Laboratory Result     During your visit today, one of your tests was abnormal.     Abnormal laboratory results are common. Most of the time an incidental abnormal laboratory result is nothing serious. Your doctor today doesn t think anything needs to be done about your laboratory result right away. You might need another exam or more tests to find out why you have this abnormal lab result. At this time, the cause of laboratory results does not seem dangerous. You do not need to stay in the hospital.     We don’t believe your condition is dangerous right now. However, you need to be careful. Sometimes a problem that seems small can get serious later. This is why it is very important to come back here or go to the nearest Emergency Department unless you are 100% improved.     In rare cases an abnormal laboratory result can be a sign of something serious or that you are developing an illness. This is why it s important for your primary doctor to keep a close eye on you. They will need to recheck your abnormal laboratory result.     YOU SHOULD SEEK MEDICAL ATTENTION IMMEDIATELY, EITHER HERE OR AT THE NEAREST EMERGENCY DEPARTMENT, IF ANY OF THE FOLLOWING OCCURS:     · You can t get your laboratory result rechecked in the time period your doctor recommended today.  · You have a fever (temperature higher than 100.4°F or 38°C).   · You get any other symptoms, concerns, or don t get better as expected.  · You get worse or feel you can t wait until your follow-up appointment to get treated.     If you can t follow up with your doctor, or if at any time you feel you need to be rechecked or seen again, come back here or go to the nearest emergency department.           Urinary Tract Infection     You have been diagnosed with a lower urinary tract infection (UTI). This is also called cystitis.     Cystitis is an infection in your bladder. Your doctor diagnosed it by testing your urine. Cystitis usually causes burning  with urination or frequent urination. It might make you feel like you have to urinate even when you don’t.      Cystitis is usually treated with antibiotics and medicine to help with pain.     It is VERY IMPORTANT that you fill your prescription and take all of the antibiotics as directed. If a lower urinary tract infection goes untreated for too long, it can become a kidney infection.     FOR WOMEN: To reduce the risk of getting cystitis again:  · Always urinate before and after sexual intercourse.  · Always wipe from front to back after urinating or having a bowel movement. Do not wipe from back to front.  · Drink plenty of fluids. Try to drink cranberry or blueberry juice. These juices have a chemical that stops bacteria from “sticking” to the bladder.     YOU SHOULD SEEK MEDICAL ATTENTION IMMEDIATELY, EITHER HERE OR AT THE NEAREST EMERGENCY DEPARTMENT, IF ANY OF THE FOLLOWING OCCURS:  · You have a fever (temperature higher than 100.4ºF / 38ºC) or shaking chills.  · You feel nauseated or vomit.  · You have pain in your side or back.  · You don’t get better after taking all of your antibiotics.  · You have any new symptoms or concerns.  · You feel worse or do not   improve.

## 2015-08-16 NOTE — ED Notes (Signed)
Patient refuses to remove her nose ring, a bracelet, and a belly button ring. States she wont hurt herself with any of those items. Patients mother advised that she is responsible for those items on the patient at this time. Patient advised she may keep the aforementioned articles, but those articles may have to be removed if she is reccommended for inpatient psych treatment. Patient is currently in paper scrubs with personal objects in the mothers possession
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
## 2015-08-17 ENCOUNTER — Encounter (HOSPITAL_BASED_OUTPATIENT_CLINIC_OR_DEPARTMENT_OTHER): Payer: Self-pay

## 2015-08-17 ENCOUNTER — Inpatient Hospital Stay: Payer: Commercial Managed Care - POS | Admitting: Psychiatry

## 2015-08-17 ENCOUNTER — Inpatient Hospital Stay
Admission: AD | Admit: 2015-08-17 | Discharge: 2015-08-21 | DRG: 895 | Disposition: A | Discharge status: Home / Self Care | Payer: Commercial Managed Care - POS | Source: Ambulatory Visit | Attending: Psychiatry | Admitting: Psychiatry

## 2015-08-17 DIAGNOSIS — F10239 Alcohol dependence with withdrawal, unspecified: Principal | ICD-10-CM | POA: Diagnosis present

## 2015-08-17 DIAGNOSIS — Z85850 Personal history of malignant neoplasm of thyroid: Secondary | ICD-10-CM

## 2015-08-17 DIAGNOSIS — R6889 Other general symptoms and signs: Secondary | ICD-10-CM | POA: Diagnosis present

## 2015-08-17 DIAGNOSIS — F419 Anxiety disorder, unspecified: Secondary | ICD-10-CM | POA: Diagnosis present

## 2015-08-17 DIAGNOSIS — A6920 Lyme disease, unspecified: Secondary | ICD-10-CM | POA: Diagnosis present

## 2015-08-17 DIAGNOSIS — E890 Postprocedural hypothyroidism: Secondary | ICD-10-CM | POA: Diagnosis present

## 2015-08-17 DIAGNOSIS — N390 Urinary tract infection, site not specified: Secondary | ICD-10-CM | POA: Diagnosis present

## 2015-08-17 LAB — ECG 12-LEAD
Atrial Rate: 106 {beats}/min
P Axis: 74 degrees
P-R Interval: 150 ms
Q-T Interval: 362 ms
QRS Duration: 94 ms
QTC Calculation (Bezet): 480 ms
R Axis: 47 degrees
T Axis: 74 degrees
Ventricular Rate: 106 {beats}/min

## 2015-08-17 MED ORDER — LORAZEPAM 1 MG PO TABS
1.0000 mg | ORAL_TABLET | ORAL | Status: AC | PRN
Start: 2015-08-19 — End: 2015-08-20
  Administered 2015-08-19 (×3): 1 mg via ORAL
  Filled 2015-08-17 (×3): qty 1

## 2015-08-17 MED ORDER — LEVOTHYROXINE SODIUM 25 MCG PO TABS
175.0000 ug | ORAL_TABLET | Freq: Every evening | ORAL | Status: DC
Start: 2015-08-17 — End: 2015-08-21
  Administered 2015-08-17 – 2015-08-20 (×4): 175 ug via ORAL
  Filled 2015-08-17 (×2): qty 3
  Filled 2015-08-17 (×2): qty 1
  Filled 2015-08-17 (×2): qty 3
  Filled 2015-08-17 (×2): qty 1

## 2015-08-17 MED ORDER — IBUPROFEN 600 MG PO TABS
600.0000 mg | ORAL_TABLET | Freq: Four times a day (QID) | ORAL | Status: DC | PRN
Start: 2015-08-17 — End: 2015-08-21
  Administered 2015-08-18 – 2015-08-20 (×2): 600 mg via ORAL
  Filled 2015-08-17 (×2): qty 1

## 2015-08-17 MED ORDER — QUETIAPINE FUMARATE 25 MG PO TABS
50.0000 mg | ORAL_TABLET | Freq: Every evening | ORAL | Status: DC | PRN
Start: 2015-08-17 — End: 2015-08-21
  Administered 2015-08-18: 50 mg via ORAL
  Administered 2015-08-18: 25 mg via ORAL
  Administered 2015-08-19 – 2015-08-20 (×2): 50 mg via ORAL
  Filled 2015-08-17: qty 1
  Filled 2015-08-17 (×4): qty 2

## 2015-08-17 MED ORDER — CALCIUM CARBONATE ANTACID 500 MG PO CHEW
500.0000 mg | CHEWABLE_TABLET | Freq: Three times a day (TID) | ORAL | Status: DC
Start: 2015-08-17 — End: 2015-08-21
  Administered 2015-08-17 – 2015-08-21 (×9): 500 mg via ORAL
  Filled 2015-08-17 (×16): qty 1

## 2015-08-17 MED ORDER — LORAZEPAM 1 MG PO TABS
1.0000 mg | ORAL_TABLET | Freq: Three times a day (TID) | ORAL | Status: DC | PRN
Start: 2015-08-21 — End: 2015-08-21
  Administered 2015-08-21: 1 mg via ORAL
  Filled 2015-08-17: qty 1

## 2015-08-17 MED ORDER — CEPHALEXIN 500 MG PO CAPS
500.0000 mg | ORAL_CAPSULE | Freq: Two times a day (BID) | ORAL | Status: DC
Start: 2015-08-17 — End: 2015-08-21
  Administered 2015-08-17 – 2015-08-21 (×9): 500 mg via ORAL
  Filled 2015-08-17 (×9): qty 1

## 2015-08-17 MED ORDER — LORAZEPAM 1 MG PO TABS
1.0000 mg | ORAL_TABLET | Freq: Four times a day (QID) | ORAL | Status: AC | PRN
Start: 2015-08-20 — End: 2015-08-21
  Administered 2015-08-20 (×3): 1 mg via ORAL
  Filled 2015-08-17 (×4): qty 1

## 2015-08-17 MED ORDER — LORAZEPAM 1 MG PO TABS
2.0000 mg | ORAL_TABLET | ORAL | Status: AC | PRN
Start: 2015-08-17 — End: 2015-08-18
  Administered 2015-08-17 (×5): 2 mg via ORAL
  Filled 2015-08-17 (×5): qty 2

## 2015-08-17 MED ORDER — FOLIC ACID 1 MG PO TABS
1.0000 mg | ORAL_TABLET | Freq: Every day | ORAL | Status: DC
Start: 2015-08-17 — End: 2015-08-21
  Administered 2015-08-17 – 2015-08-21 (×5): 1 mg via ORAL
  Filled 2015-08-17 (×5): qty 1

## 2015-08-17 MED ORDER — MAGNESIUM HYDROXIDE 400 MG/5ML PO SUSP
30.0000 mL | Freq: Every evening | ORAL | Status: DC | PRN
Start: 2015-08-17 — End: 2015-08-21

## 2015-08-17 MED ORDER — LOPERAMIDE HCL 2 MG PO CAPS
2.0000 mg | ORAL_CAPSULE | ORAL | Status: DC | PRN
Start: 2015-08-17 — End: 2015-08-21

## 2015-08-17 MED ORDER — IBUPROFEN 400 MG PO TABS
400.0000 mg | ORAL_TABLET | Freq: Once | ORAL | Status: AC
Start: 2015-08-17 — End: 2015-08-17
  Administered 2015-08-17: 400 mg via ORAL
  Filled 2015-08-17: qty 1

## 2015-08-17 MED ORDER — LORAZEPAM 1 MG PO TABS
2.0000 mg | ORAL_TABLET | ORAL | Status: AC | PRN
Start: 2015-08-18 — End: 2015-08-19
  Administered 2015-08-18 (×3): 2 mg via ORAL
  Filled 2015-08-17 (×3): qty 2

## 2015-08-17 MED ORDER — THIAMINE HCL 100 MG PO TABS
100.0000 mg | ORAL_TABLET | Freq: Every day | ORAL | Status: DC
Start: 2015-08-17 — End: 2015-08-21
  Administered 2015-08-17 – 2015-08-21 (×5): 100 mg via ORAL
  Filled 2015-08-17 (×5): qty 1

## 2015-08-17 MED ORDER — TAB-A-VITE/BETA CAROTENE PO TABS
1.0000 | ORAL_TABLET | Freq: Every day | ORAL | Status: DC
Start: 2015-08-17 — End: 2015-08-21
  Administered 2015-08-17 – 2015-08-21 (×5): 1 via ORAL
  Filled 2015-08-17 (×5): qty 1

## 2015-08-17 MED ORDER — TUBERCULIN PPD 5 UNIT/0.1ML ID SOLN
0.1000 mL | Freq: Once | INTRADERMAL | Status: AC
Start: 2015-08-17 — End: 2015-08-17
  Administered 2015-08-17: 0.1 mL via INTRADERMAL

## 2015-08-17 MED ORDER — CALCITRIOL 0.25 MCG PO CAPS
0.2500 ug | ORAL_CAPSULE | Freq: Three times a day (TID) | ORAL | Status: DC
Start: 2015-08-17 — End: 2015-08-21
  Administered 2015-08-17 – 2015-08-21 (×9): 0.25 ug via ORAL
  Filled 2015-08-17 (×16): qty 1

## 2015-08-17 MED ORDER — ONDANSETRON 4 MG PO TBDP
4.0000 mg | ORAL_TABLET | Freq: Four times a day (QID) | ORAL | Status: DC | PRN
Start: 2015-08-17 — End: 2015-08-21

## 2015-08-17 MED ORDER — ALUM & MAG HYDROXIDE-SIMETH 200-200-20 MG/5ML PO SUSP
30.0000 mL | ORAL | Status: DC | PRN
Start: 2015-08-17 — End: 2015-08-21

## 2015-08-17 NOTE — Plan of Care (Signed)
Problem: Safe medical management of withdrawal from (list substances of abuse) AS EVIDENCED BY...  Goal: Safe detoxification/education about relapse prevention/engagement in discharge planning  Intervention: Monitor participation in all unit activities  Jamie Dixon is cooperative with her care and makes her needs known to staff. Her mother and sister visited this evening. She watched television with her peers in the unit lounge. Will continue to monitor and report progress to oncoming shift.
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
## 2015-08-17 NOTE — CATS Treatment History (Signed)
CHEMICAL DEPENDENCE AND MENTAL HEALTH TREATMENT HISTORY  INCLUDE BOTH INPATIENT AND OUTPATIENT TREATMENT ATTEMPTS            Facility Name Dates Presenting Problem Outcome/Years Sober    [facility] IP  08/17/15  alcohol

## 2015-08-17 NOTE — Plan of Care (Signed)
Problem: Safety  Goal: Patient will be free from injury during hospitalization  Intervention: Provide and maintain safe environment  Jamie Dixon continues to be assessed Q 2 H and PRN per day one unit protocol.  [name] been given scheduled medications and a dose of Ativan 2 mg po for her withdrawal symptoms of tremors, sweats, anxiety and agitation.  This has been effective in reducing her withdrawal symptoms.  [name] been observed participating in all unit activities and is seen interacting with others.  He parents came on the unit to visit thinking visiting hours were from 8 am to 9 pm.  They were given the family folder of unit information.  PPD given to left forearm.  Tolerated well.  Will continue to monitor closely, educate and provide support.  Blood pressure 120/88, pulse 96, temperature 99.7 F (37.6 C), temperature source Oral, resp. rate 16, height 1.702 m (5\' 7" ), weight 54.432 kg (120 lb), last menstrual period 08/08/2015, SpO2 97 %.  CIWA scores 7, 7, 10 and 7.
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
## 2015-08-17 NOTE — Progress Notes (Signed)
Writer completed prior authorization for inpatient detox. Pt was approved for January 11- 13. Reference number [id number].

## 2015-08-17 NOTE — Discharge Instructions (Addendum)
[facility] Continuing Care Plan, including the After Visit Summary (AVS) and the Psychiatrist's Discharge Summary are in EPIC and viewable by the [facility] day treatment team.    RECOMMENDED THAT YOU COMPLY WITH THE FOLLOWING PLAN:   Follow up appointment with:   [facility] Day Treatment           Date: 08/22/15       Time: 9:00am  Location:   [facility] Day Treatment  P: [phone]  F: [phone]    FOR EMERGENCY MENTAL HEALTH, CONTACT:  [facility]:  [address], [place], [place] [address]   [phone]; or call 911 or go to the closest emergency department.      National Suicide Prevention Lifeline :  563-809-5538    The following was reviewed with patient/family by ________________________ RN.    1. Reason for IP admission  2. Major procedures and tests, including summary of results  3. Diagnosis at discharge  4. Current medication list  5. Studies pending at discharge:       No      Yes                                                        If yes, you may call for results at: _______________________________(unit #)  6. Patient instructions  7. Patient has Advance Directive:     Yes      No                                                          If no, reason patient did not wish or was not able to name a surrogate decision maker or provide an Advance Directive :_______________________________          8. Contact information for emergencies  9. Plan for follow up care  10. Name of provider, appointment  and location of follow up care.  11.         Tobacco Cessation Discharge Referral for Evidence Based Counseling and Cessation Medication:  Referral for EVB counseling:  a. A referral appointment for evidence-based counseling was offered, but patient declined.  Is not interested in tobacco cessation at this time.  Was advised to reconsider tobacco cessation counseling.  Referral information for smoking cessation providers, and instructions to make an appointment were  provided to patient at the time of discharge.    Patient was provided with the following Smoking Cessation Providers:  Rock Nephew, LPC  [address] [place] [address]  [phone]  [facility]  [address], [place], [place] [address]  [phone]  [facility]  [address], [place], [place] [address]  [phone]  Doretha Sou Med, Leon, NCC, Individual, Couple, and [facility]  [address] [place], [place] [address]  [phone]  Gurusher Lyndel Safe, Med, LPC  [address] [place], [place] [address]  [phone]  [facility]  [address] [place], [place] [address]  [phone]  Maggie Font [facility]  [address], [place], [place] [address]  [phone]  [facility]  [place], [place] [address]  [phone]  [facility]  [address], [place], [place] [address]  [phone]  www.naquitline.org: online and telephone counselors  . National Network of Tobacco Cessation Quitlines  Toll free hotline: 1-800-QUITNOW (510) 131-3020)  TTY: [phone]

## 2015-08-17 NOTE — ED Notes (Signed)
PTS given report

## 2015-08-17 NOTE — Plan of Care (Signed)
Kambrie was assessed and medicated with Ativan 2 mg po for CIWA score of 12 with tremors, sweats, anxiety and agitation.  Tums was given on request as she refused it at the scheduled time of 1300.  She ate dinner with her peers in the group room and went to her room to lie down and rest.  Will continue to follow plan of care and report to oncoming staff at shifts end.  Blood pressure 119/69, pulse 94, temperature 99.7 F (37.6 C), temperature source Oral, resp. rate 15, height 1.702 m (5\' 7" ), weight 54.432 kg (120 lb), last menstrual period 08/08/2015, SpO2 97 %.

## 2015-08-17 NOTE — Progress Notes (Signed)
Kim from [facility] called stating she made a mistake and will actually be authorizing 6 days of inpatient detox, now with review on January 17th. The new reference number is [id number]
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
## 2015-08-17 NOTE — Plan of Care (Signed)
Jamie Dixon has been assessed per the CIWA protocol. She was medicated at 0422 with Ativan 2 mg po for c/o anxiety, restlessness and tremors. Jamie Dixon continued to be restless after being medicated and was up walking around the unit this morning. At [phone] she was given Ativan 2 mg po for withdrawal mgmt. Will continue to monitor and support.
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
## 2015-08-18 NOTE — Progress Notes (Signed)
Task Presentation Group Note: Pt presented step-1 report to group and related to powerlessness as evidenced by having preoccupation and provided examples. Pt shared that she was preoccupied all of the time inclusive to times when she was at work and home. Pt stated that her attempts to control use was by reducing size and attended some AA meeting to help. Pt does believe her life is unmanageable and she is powerless over alcohol. Pt stated her family drink but they are not alcoholic like her. She feels that all her relationships has been damaged from her use. In her summary she talked about how it affecting her behavior and her needs to stay sober. Pt has insight awareness of her condition.

## 2015-08-18 NOTE — Plan of Care (Signed)
Problem: Safe medical management of withdrawal from (list substances of abuse) AS EVIDENCED BY...  Goal: Safe detoxification/education about relapse prevention/engagement in discharge planning  Intervention: Monitor participation in all unit activities  Jamie Dixon is cooperative with her care and makes her needs known to staff. She attended the [employer] and the AA [employer] meeting conducted on the unit. She interacted with her peers. Will continue to monitor and report progress to oncoming shift.

## 2015-08-18 NOTE — Plan of Care (Signed)
Patient assessed  this shift at  0749 and 1159 per CIWA protocol with CIWA=8,4  For complaints of tremors,sweats,anxiety and  mild agitation.She received Ativan 2mg  at [phone] with good relief reported.Patient has been visible in the milieu interacting with peers and attending groups.Nursing will continue to monitor and support,

## 2015-08-18 NOTE — Progress Notes (Signed)
PROGRESS NOTE    Date Time: 08/18/2015 12:21 PM  Patient Name: Jamie Dixon, Jamie Dixon      Assessment:   35 year old female with history of thyroid cancer s/p thyroidectomy, acute lyme disease, anxiety, depression, and history of mania with SSRI is admitted to [facility] IP for alcohol use disorder with withdrawal.    Diagnoses:  Alcohol use disorder   Alcohol withdrawal   Depression  Anxiety  Hypothyroidism   H/O of thyroid cancer   Status post thyroidectomy 2013     Plan:    Ativan PRN according to CIWA scores   Folic acid, multivitamin, and thiamine supplementation   Repeat liver function panel and calcium ordered for tomorrow morning   Substance abuse counseling - discussed elevated liver enzymes today, patient provided with written information on anticraving medications (she is interested in starting naltrexone around the time of discharge but has not yet made a final decision)   Keflex for UTI - should follow up susceptibilities of urine culture (gram negative rod)   Discharge planning - patient unsure at this time    Subjective:   The patient reports that her primary withdrawal symptom is a lot of anxiety.  She states she slept OK.  She has not experienced sweats or shakes.  She is unsure of her aftercare plan currently.    Medications:     Current Facility-Administered Medications   Medication Dose Route Frequency   . calcitRIOL  0.25 mcg Oral TID   . calcium carbonate  500 mg Oral TID   . cephALEXin  500 mg Oral BID   . folic acid  1 mg Oral Daily   . levothyroxine  175 mcg Oral QHS   . multivitamin  1 tablet Oral Daily   . thiamine  100 mg Oral Daily       Review of Systems:   A comprehensive review of systems was: Negative except significant anxiety    Physical Exam:     Filed Vitals:    08/18/15 1159   BP: 145/95   Pulse: 108   Temp: 99.2 F (37.3 C)   Resp: 18   SpO2:        Intake and Output Summary (Last 24 hours) at Date Time  No intake or output data in the 24 hours ending 08/18/15 1221    General  appearance - alert, a bit disheveled, and in no distress, oriented to person, place, and time and normal appearing weight  Mental status - alert, oriented to person, place, and time, mood is anxious, normal behavior, quiet speech, clothes are stained, normal motor activity, and thought processes  Eyes - sclera anicteric, no conjunctival injection  Chest - no tachypnea, retractions or cyanosis  Heart - skin is of normal color without pallor or cyanosis, mental status is clear  Neurological - alert, oriented, normal speech, no focal findings or movement disorder noted  Skin - normal coloration and turgor    Labs:     Results     ** No results found for the last 24 hours. **          Rads:   Radiological Procedure reviewed.    Signed by: Eveline Keto

## 2015-08-18 NOTE — Plan of Care (Addendum)
Problem: Safe medical management of withdrawal from (list substances of abuse) AS EVIDENCED BY...  Goal: Compliance with management of withdrawal syndrome  Intervention: Assess withdrawal signs/symptoms according to identified protocol e.g. CIWA/COWS  Jamie Dixon was assessed at 1622 for alcohol withdrawals. She appeared tremulous and anxious. She reported she is going to make calls for after care plans which is making her nervous.  Current CIWA - 10. Ativan 2 mg PO was given. She refused her afternoon calcitroil/tums per previous RN. [name] reported she takes 2 doses per scheduled and third dose only if she is symptomatic. She denied feeling of low calcium- tetany, weakness and dizziness. She was seen participating into unit activities during evening. Will continue to monitor and report to night shift.

## 2015-08-18 NOTE — Progress Notes (Signed)
Counseling Note:  Pt attended community group this morning and reported feeling happy and worried about the future. Patient is working on the [employer] task and is ready to present today. Pt shared that she did not have a sponsor, did not make any phone calls, and did  go to a meeting yesterday.  Pt said that she is grateful for friends and peers at [facility].  Counselor later met with the pt, and pt presented as very anxious as evidenced by rapid speech, tangential thought process, stated excessive "worries" and flinching in her chair. At one point tearfully stated "I am having a panic attack."  Counselor and pt practiced grounding exercises such as breathing, sensory awareness, and relaxation of upper body. Patient was able to self soothe and continue with conversation within a short time. Discussion included housing concerns/options upon discharge, FMLA paperwork, cost of treatment and transportation to treatment. At the end of discussion Patient impulsively stated she was not a "fan" of groups and preferred "[facility]." Noted.       Transition  Planning Note: Counselor and pt discussed plan for aftercare following discharge. Patient does not want to go to residential treatment, "I need to get back to work." Overview of Day Treatment and IOP provided along with cost estimate. Per patient request, contact numbers/lists of [place] [facility] [place] [facility] [employer] provided.    Family Interview Note: Patient mother Verdell Carmine, [phone]) left VM early in day about wanting patient to attend residential after discharge. Counselor returned call early afternoon. Immediately she stated concerns as patient's "depression and high level of drama which is why she cannot return home" at discharge. Counselor discussed levels of care recently reviewed with patient and emphasis was placed on pt's decision process/level of motivation.  She went on to say the family would be attending family meeting if weather permitted and  understood family group was educational.

## 2015-08-18 NOTE — Plan of Care (Addendum)
Jamie Dixon is cooperative with her care. At 2027, her vital signs were as follows: BP: 128/98, pulse 98, respirations 16 and temperature 98.9 F. Her CIWA score was 8. Symptoms she experienced were moderate anxiety, tremors and sweating. She was given Ativan 2 mg PO for her symptoms.   At 2141, her vital signs were as follows: BP: 140/84, pulse 106 and respirations 17. Her CIWA score was 5. She requested and received Seroquel 50 mg PO for sleep. She complied with her scheduled medications to include dose of Calcitrol. She requested that TUMS be held as she wanted to take the dose later in the shift; she later took the dose at 0246.   At 0000 and 0400, her vital signs were stable. Her CIWA scores were 4 and 3. She continued to sleep.   Will continue to monitor and report progress to oncoming shift.

## 2015-08-18 NOTE — Plan of Care (Signed)
Jamie Dixon is cooperative with her care. At 2028, her vital signs were as follows: BP: 142/83, pulse 96, respirations 16 and temperature 98.6 F. Her CIWA score was 9. Symptoms she experienced were moderate anxiety, tremors, sweating and agitation. She was given Ativan 2 mg PO for her symptoms. She accepted her scheduled medications except for TUMS which was held as she stated she may want to take the medication later.   At 2206 and 0007, her vital signs were stable. Her CIWA scores were 5 and 3. She went to her room at 2315 to rest. She slept on and off. At 0139, she requested and received Seroquel 25 mg PO for sleep. She also requested and received Ibuprofen 600 mg PO for pain in her joints that she rated a 6 out of 10. Upon reassessment, the pain decreased to a 1.   At 0411, her vital signs were stable. Her CIWA score was 3. She went back to sleep.   Will continue to monitor and report progress to oncoming shift.

## 2015-08-19 DIAGNOSIS — Z7141 Alcohol abuse counseling and surveillance of alcoholic: Secondary | ICD-10-CM

## 2015-08-19 DIAGNOSIS — F419 Anxiety disorder, unspecified: Secondary | ICD-10-CM

## 2015-08-19 DIAGNOSIS — R6889 Other general symptoms and signs: Secondary | ICD-10-CM

## 2015-08-19 DIAGNOSIS — F1020 Alcohol dependence, uncomplicated: Secondary | ICD-10-CM

## 2015-08-19 DIAGNOSIS — F10239 Alcohol dependence with withdrawal, unspecified: Principal | ICD-10-CM

## 2015-08-19 LAB — HEPATIC FUNCTION PANEL
ALT: 47 U/L (ref 0–55)
AST (SGOT): 69 U/L — ABNORMAL HIGH (ref 5–34)
Albumin/Globulin Ratio: 1.1 (ref 0.9–2.2)
Albumin: 3.7 g/dL (ref 3.5–5.0)
Alkaline Phosphatase: 58 U/L (ref 37–106)
Bilirubin Direct: 0.4 mg/dL (ref 0.0–0.5)
Bilirubin Indirect: 0.4 mg/dL (ref 0.0–1.0)
Bilirubin, Total: 0.8 mg/dL (ref 0.1–1.2)
Globulin: 3.3 g/dL (ref 2.0–3.7)
Protein, Total: 7 g/dL (ref 6.0–8.3)

## 2015-08-19 LAB — HEMOLYSIS INDEX: Hemolysis Index: 1 (ref 0–18)

## 2015-08-19 LAB — CALCIUM: Calcium: 8.2 mg/dL — ABNORMAL LOW (ref 8.5–10.5)

## 2015-08-19 NOTE — Progress Notes (Signed)
PROGRESS NOTE    Date Time: 08/19/2015 7:36 AM  Patient Name: Jamie Dixon      Chief Complaint:   Anxiety  Tremors  Denies hallucinations        Assessment:   Alcohol Use Disorder  Alcohol Withdrawal  Anxiety          Plan:   Continue with Detoxification  Ativan Protocol  SA Counseling  Discussed with treatment team  Review labs with patients  IOP Triage  Discharge planning           Medications:     Current Facility-Administered Medications   Medication Dose Route Frequency   . calcitRIOL  0.25 mcg Oral TID   . calcium carbonate  500 mg Oral TID   . cephALEXin  500 mg Oral BID   . folic acid  1 mg Oral Daily   . levothyroxine  175 mcg Oral QHS   . multivitamin  1 tablet Oral Daily   . thiamine  100 mg Oral Daily       Review of Systems:   A comprehensive review of systems was negative except for:   Respiratory: Negative  Cardiovascular: Negative  Gastrointestinal: Negative  Genitourinary: Negative  Musculoskeletal: Negative  Neurological: positive for tremors    Physical Exam:     Filed Vitals:    08/19/15 0400   BP: 112/71   Pulse: 81   Temp:    Resp: 16   SpO2:        General appearance - oriented to person, place, and time and anxious  Mental status -  Anxious mood, guarded  Eyes - pupils equal and reactive, extraocular eye movements intact  Ears - bilateral TM's and external ear canals normal  Nose - no septal perforation  Mouth - mucous membranes moist, pharynx normal without lesions  Neck - supple, no significant adenopathy  Chest - clear to auscultation, no wheezes, rales or rhonchi, symmetric air entry  Heart - normal rate, regular rhythm, normal S1, S2, no murmurs, rubs, clicks or gallops  Abdomen - soft, nontender, nondistended, no masses or organomegaly  Rectal - deferred to PCP.  Neurological - alert, oriented, normal speech, no focal findings or movement disorder noted, screening mental status exam normal, neck supple without rigidity, cranial nerves II through XII intact, motor and sensory grossly  normal bilaterally, normal muscle tone, mild tremors, strength 5/5  Musculoskeletal - no joint tenderness, deformity or swelling  Extremities - peripheral pulses normal, no pedal edema, no clubbing or cyanosis  Skin - normal      Labs:     Results     ** No results found for the last 24 hours. **              Signed by: Nicholaus Bloom, MD

## 2015-08-19 NOTE — Plan of Care (Signed)
Jamie Dixon is cooperative with her care. At 2010, her vital signs were as follows: BP: 135/84, pulse 102 and respirations 16. Her CIWA score was 8. Symptoms she experienced were moderate anxiety, agitation and tremors. She was given Ativan 1 mg PO for her symptoms. She accepted her scheduled medications. She requested that her scheduled dose of TUMS 500 mg PO be placed on hold so that she could take it later in the shift. She later refused the dose. She continued to work on a puzzle in the [employer].   At 2224, her vital signs were as follows: BP: 124/91, pulse 94 and respirations 18. Her CIWA score was 5. She requested and received Seroquel 50 mg PO for sleep.   At 0010, her vital signs were as follows: BP: 107/73, pulse 105 and respirations 18. Her CIWA score was 4. She denied concerns and continued to sleep.   At 0404, her vital signs were as follows: BP: 114/78, pulse 119 and respirations 17. Her CIWA score was 8. She was given Ativan 1 mg PO for her symptoms. She requested and received Ibuprofen 600 mg PO for menstrual cramps she rated a 5 out of 10. Upon reassessment, her pain decreased to a 1. She went back to sleep.   Will continue to monitor and report progress to oncoming shift.

## 2015-08-19 NOTE — Progress Notes (Signed)
Family Group Note: Pt and pt's close friend Maralyn Sago) attended family group on patterns of communication in addicted families. Both pt and friend were attentive and engaged, asking questions and contributing to the conversation. Pt expressed concern about her mother's emotional manipulation and verbal aggression and pt stated she had a difficult time setting boundaries. Staff and peers provided emotional support and discussed boundary issues as a group.

## 2015-08-19 NOTE — Plan of Care (Signed)
Problem: Safe medical management of withdrawal from (list substances of abuse) AS EVIDENCED BY...  Goal: Compliance with management of withdrawal syndrome  Intervention: Assess withdrawal signs/symptoms according to identified protocol e.g. CIWA/COWS  Nursing Note: (P) Alcohol Dependence, pt assessed 0809 BP 140/91, Pulse 114, Respiration 16, Temperature 98.7, CIWA 3 (I) No meds given (O) 0905 BP 138/102, Pulse 111, Respiration 16, CIWA 8, anxiety, agitation. Pt given Ativan 1 mg and scheduled meds. 1226 BP 139/89, Pulse 111, Respiration 16, CIWA 2, pt given scheduled medication.

## 2015-08-19 NOTE — Progress Notes (Signed)
Counseling Note:   Pt and this counselor met 1:1 to discuss aftercare plan and assess treatment progress. Pt presented to this writer with anxious mood and affect evidenced by restlessness with rapid speech, tangential thought processing, and struggle to sit still. Although pt was goal oriented, however purpose of identifying aftercare plan with this writer was to discharge self from detox sooner than the discharge date. Pt reports she's ready to take next step towards staying clean and sober. Pt demonstrates willingness to continue treatment at [facility] level followed by intensive sober support meetings. Pt was notified by this writer that once her discharge date is confirmed by attending MD she'll be scheduled for [facility]. Pt agreed.     Pt attended community meeting this morning and reported feeling 'pretty good, like myself'. Pt reported that she has a sponsor, called sponsor and went to the meeting yesterday. Pt shared that she's continued to working 'My Journal'. Pt shared that she's grateful for 'friends' support and people here'. Pt was visible in the milieu socializing with peers and staffs, and participating in aftercare plans. Pt attended therapeutic groups scheduled for today.     Transition Planning Note:   Counselor and pt discussed plan for aftercare following discharge. Pt's plan is to do [facility] following discharge. Pt reports she'll do day treatment and live with parents during treatment. Pt reports parents are aware of and agreed with the continued aftercare plans. Date and time will be determined after pt is confirmed for discharge by attending MD.     Treatment Plan Reassessment Note:   Pt's treatment plan objectives were reviewed, initialed by pt and this counselor and placed in pt's charge.    Group Attendance: Actively attended all therapeutic groups and sober support meetings. Pt's participation in group was appropriate as she provided and received positive feedback  to/from peers and staffs.   Task Assignment: Pt continues to work on 'My Personal Journal'.    Aftercare Plan: Pt is scheduled for [facility]. Assessment Date and Time to be determined.

## 2015-08-19 NOTE — Plan of Care (Signed)
Problem: Safe medical management of withdrawal from (list substances of abuse) AS EVIDENCED BY...  Goal: Compliance with management of withdrawal syndrome  Intervention: Assess withdrawal signs/symptoms according to identified protocol e.g. CIWA/COWS  Nursing Note: (P) Alcohol Dependence, pt assessed 1544 BP 140/92, Pulse 117, Respiration 16, CIWA 8, anxiety, agitation, tremor (I) Pt given Ativan 1 mg (O) Pt attending groups.

## 2015-08-19 NOTE — Plan of Care (Signed)
Problem: Safe medical management of withdrawal from (list substances of abuse) AS EVIDENCED BY...  Goal: Safe detoxification/education about relapse prevention/engagement in discharge planning  Intervention: Monitor participation in all unit activities  Jamie Dixon is cooperative with her care and approaches staff to make her needs known. She attended the AA meeting conducted on the unit. She interacted with her peers and was working on finishing her puzzle in the unit lounge. Will continue to monitor and report progress to oncoming shift.

## 2015-08-20 NOTE — Progress Notes (Signed)
PROGRESS NOTE    Date Time: 08/20/2015 7:38 AM  Patient Name: Jamie Dixon      Chief Complaint:   Anxiety  Tremors  Blames on Low Calcium and being here  Wants to step down to PHP        Assessment:   Alcohol Use Disorder  Alcohol Withdrawal  Anxiety  Hypocalcemia          Plan:   Continue with Detoxification  Ativan Protocol  SA Counseling  Discussed with treatment team  Review labs with patients  PHP TRIAGE FOR Tuesday 08/22/2015  Discharge planning           Medications:     Current Facility-Administered Medications   Medication Dose Route Frequency   . calcitRIOL  0.25 mcg Oral TID   . calcium carbonate  500 mg Oral TID   . cephALEXin  500 mg Oral BID   . folic acid  1 mg Oral Daily   . levothyroxine  175 mcg Oral QHS   . multivitamin  1 tablet Oral Daily   . thiamine  100 mg Oral Daily       Review of Systems:   A comprehensive review of systems was negative except for:   Respiratory: Negative  Cardiovascular: Negative  Gastrointestinal: Negative  Genitourinary: Negative  Musculoskeletal: Negative  Neurological: positive for tremors    Physical Exam:     Filed Vitals:    08/20/15 0404   BP: 114/78   Pulse: 119   Temp:    Resp: 17   SpO2:        General appearance - oriented to person, place, and time and anxious  Mental status -  Anxious mood, guarded  Eyes - pupils equal and reactive, extraocular eye movements intact  Ears - bilateral TM's and external ear canals normal  Nose - no septal perforation  Mouth - mucous membranes moist, pharynx normal without lesions  Neck - supple, no significant adenopathy  Chest - clear to auscultation, no wheezes, rales or rhonchi, symmetric air entry  Heart - normal rate, regular rhythm, normal S1, S2, no murmurs, rubs, clicks or gallops  Abdomen - soft, nontender, nondistended, no masses or organomegaly  Neurological - alert, oriented, normal speech, no focal findings or movement disorder noted, screening mental status exam normal, neck supple without rigidity, cranial  nerves II through XII intact, motor and sensory grossly normal bilaterally, normal muscle tone, mild tremors  Musculoskeletal - no joint tenderness, deformity or swelling  Extremities - peripheral pulses normal, no pedal edema, no clubbing or cyanosis  Skin - normal      Labs:     Results     Procedure Component Value Units Date/Time    Hepatic function panel (LFT) [[id number]]  (Abnormal) Collected:  08/19/15 0815    Specimen Information:  Blood Updated:  08/19/15 1014     Bilirubin, Total 0.8 mg/dL      Bilirubin, Direct 0.4 mg/dL      Bilirubin, Indirect 0.4 mg/dL      AST (SGOT) 69 (H) U/L      ALT 47 U/L      Alkaline Phosphatase 58 U/L      Protein, Total 7.0 g/dL      Albumin 3.7 g/dL      Globulin 3.3 g/dL      Albumin/Globulin Ratio 1.1     Calcium [[id number]]  (Abnormal) Collected:  08/19/15 0815    Specimen Information:  Blood Updated:  08/19/15 1014  Calcium 8.2 (L) mg/dL     Hemolysis index [[id number]] Collected:  08/19/15 0815     Hemolysis Index 1 Updated:  08/19/15 1014              Signed by: Nicholaus Bloom, MD

## 2015-08-20 NOTE — Plan of Care (Signed)
Problem: Safe medical management of withdrawal from (list substances of abuse) AS EVIDENCED BY...  Goal: Compliance with management of withdrawal syndrome  Intervention: Assess withdrawal signs/symptoms according to identified protocol e.g. CIWA/COWS  Nursing Note: (P) Alcohol Dependence. Pt assessed 1624 BP 156/83, Pulse 102, Respiration 16, CIWA 8, tremor, anxiety, agitation (I) Pt given Ativan 1 mg. (O) Pt verbalized feeling apprehensive about her parents visiting this evening, said she fears they will upset her.

## 2015-08-20 NOTE — Plan of Care (Signed)
Problem: Safe medical management of withdrawal from (list substances of abuse) AS EVIDENCED BY...  Goal: Compliance with management of withdrawal syndrome  Intervention: Assess withdrawal signs/symptoms according to identified protocol e.g. CIWA/COWS  Nursing Note: (P) Alcohol Dependence, pt assessed 0810, BP 124/85, Pulse 111, Respiration 16, Temperature 99.8, CIWA 7, fine tremor, mild anxiety (I) Pt given scheduled meds (O) 1214 BP 136/88, Pulse 115, Respiration 16, CIWA 4, pt given scheduled medication.  Discussed aftercare. Pt stated she will attend [facility].

## 2015-08-20 NOTE — Progress Notes (Signed)
Counseling Note:  Pt initiated to meet with this counselor 1:1 to discuss aftercare plan as per pt, she wanted to be organized and having things in place before discharge on 08/21/15. Pt reported she discussed discharge options with attending MD this morning and was recommended leaving tomorrow to which pt agreed. Pt decided on [facility]. Pt also was requested for [facility] to this Clinical research associate. Pt was provided list of vacancies in [place]. Pt reported upon discharge she'll stay with her friend instead of staying with parents. Pt was also assessed for treatment progress. Pt presented as anxious evidenced by restlessness, heaving quick breathing and tangential thoughts processing. Pt was notified by this counselor to 'stick with the plan as she intends to change frequently due to anxiety'. Pt acknowledged the problem and agreed to be aware of her anxious behavior.     Transition Planning Note:  Counselor and pt discussed plan for aftercare following discharge. Pt scheduled for [facility](Day Treatment) on Tues, 08/22/15 at 9am. Pt agreed to incorporate [facility] as a long-term sobriety plan. Pt was provided with a list of [address] available in [place].

## 2015-08-21 MED ORDER — QUETIAPINE FUMARATE 50 MG PO TABS
50.0000 mg | ORAL_TABLET | Freq: Every evening | ORAL | Status: AC | PRN
Start: 2015-08-21 — End: ?

## 2015-08-21 MED ORDER — CEPHALEXIN 500 MG PO CAPS
500.0000 mg | ORAL_CAPSULE | Freq: Two times a day (BID) | ORAL | Status: DC
Start: 2015-08-21 — End: 2017-11-20

## 2015-08-21 NOTE — Progress Notes (Signed)
Nursing Discharge Note:    Jamie Dixon reports improved appetite and sleep.  She was assessed and given scheduled medications per order and a dose of Ativan 1 mg po for her alcohol withdrawal symptoms of elevated heart rate, anxiety, fine tremor and sweats.  She was evaluated by Dr Derryl Harbor.  Repeat vital signs show elevated heart rate remains.  Dr Derryl Harbor aware.  No further orders given.  Jamie Dixon participated in unit activities this morning.  She denies current S/I or H/I.  Prescriptions for Seroquel and Keflex given.  Belongings held in the pharmacy and nursing station retrieved and returned.  Left walking alone in good condition.  Friend waiting outside to give her a ride home.  She plans to return in the morning for day treatment.

## 2015-08-21 NOTE — Progress Notes (Signed)
What You Need to Know About [facility] Hospital[facility]    . Located on 1st floor of [facility].  Day Treatment runs daily (this includes weekends).  Check In is from 8:30 to 9:00 and Check Out is from 3:30 to 4:00.      . 5 groups run per day by counseling staff.  Expect to attend all groups.    . You will need to complete admission paperwork packet on your first day, even if you were just discharged from inpatient unit.  This includes consent forms for medications and release of information.    . All day treatment patients will have urine drug screening (UDS) and alcohol breathalyzer screens done at the start of each day.  You will not be medicated until these screenings are done.      . Vital signs will be checked at least twice daily by RN.       Marland Kitchen Please take all of your routine morning medications (i.e. for blood pressure, diabetes, hypothyroidism; vitamins, SSRIs, etc) at home before you come to day treatment.      . Recovery medications (Antabuse, Revia, Campral, Suboxone, Methadone, etc.) will be administered and supervised by the nurse during Day Treatment hours under the direction of the physician.      . Any controlled medications you have at home (i.e. amphetamines, benzos, narcotics, Suboxone) must be given to RN upon arrival for daily pill counts or locked up until discharge.      . Lunch break is from 12:30-1:30 PM.  Lunch is NOT provided.  You can buy food at the cafeteria or off campus as long as you return on time for the next group at 1:45 PM.  A small refrigerator and microwave are available.    . Parking is validated for patients.  Only 1 parking pass given per day.  Do not drive if you are feeling drowsy and/or impaired.      [facility] Nursing Discharge and Collaborative Plan       Date:  08/21/2015  Time: 10:11 AM  Patient Name:  Jamie Dixon  35 y.o.   06-27-1981      Date of Admission:  08/17/2015  Discharge Physician:  Dr Derryl Harbor     Labs:     Lab Results   Component Value Date    ALT 47 08/19/2015     WBC 5.42 08/16/2015    RBC 4.14* 08/16/2015    HGB 13.6 08/16/2015    HCT 40.6 08/16/2015    MCV 98.1 08/16/2015    PLT 238 08/16/2015    AST 69* 08/19/2015    ALB 3.7 08/19/2015    MG 1.6 08/16/2015       Vaccination Results:  Immunization History   Administered Date(s) Administered   . PPD Test 08/17/2015     Pneumococcal:  Not Indicated  Influenza:  Refused  TB Test:  Not Tested        Discharged Medication(s): ** Include reasons for prescribedAzzie, Thiem   Home Medication Instructions [id number]    Printed on:08/21/15 1011   Medication Information                      calcitRIOL (ROCALTROL) 0.25 MCG capsule  Take 0.25 mcg by mouth 3 (three) times daily.             calcium citrate (CALCITRATE) 950 MG tablet  Take 500 mg by mouth 3 (three) times daily.  cephALEXin (KEFLEX) 500 MG capsule  Take 1 capsule (500 mg total) by mouth 2 (two) times daily.             Levothyroxine Sodium (SYNTHROID PO)  Take 175 mcg by mouth.                LORazepam (ATIVAN) 1 MG tablet  Take 0.5 tablets (0.5 mg total) by mouth every 6 (six) hours as needed for Anxiety.                   Disposition/aftercare/Referral Plan:       Follow-up plan for sobriety was discussed with the patient.    Discharge Instructions:     Discharge instructions given to Jarrah:      Questions that may arise between hospital discharge and your first follow-up appointment should be directed to Inpatient [facility] [phone].    In event that you have a seizure upon discharge please call 911 and/or proceed to the nearest hospital.    Discharge Plan:     Referrals:  Comments   Continued Care Program  Phone #: [phone] yes   Other:  [facility] day treatment     Date:  08/22/2015 8 am       Therapist/Psychiatrist  Phone #:  no Name and contact info: psychiatrist of choice   Medical Appointment  Phone #:  no Doctor: Primary Care  Date and Time: call       TOPIC INSTRUCTION METHOD OUTCOME COMMENTS   DIET General  Discussion Learned Eat a well balanced diet.   SAFETY Potential Seizures Discussion Learned If you have any problems call 911 or go to an emergency room.     Patient Signature_____________________________ date 08/21/2015  10:11 AM    Nurse Signature________________________________date 08/21/2015 10:11 AM    Orie Rout RN  08/21/2015  10:11 AM

## 2015-08-21 NOTE — Progress Notes (Signed)
08/21/2015            To Whom It May Concern,    This letter is to notify you that Jamie Dixon was admitted to [facility] on 08/17/2015, under the medical care of  Dr. Royce Macadamia, MD.  Jamie Dixon was discharged on August 21, 2015.        Sincerely,      Brelynn Wheller Leota Jacobsen Confidentiality Requirements: The in formation has been disclosed to you from records protected by [facility] confidentiality rules (42 CFR Part 2). The Federal rules prohibit you from making any further disclosure of the information unless further disclosure is expressly permitted by the written consent of the person to whom it pertains, or as otherwise permitted by 42 CFR Part 2. A general authorization for the release of medical or other information is NOT sufficient for the purpose. The Federal rules restrict any use of the information to investigate or prosecute any alcohol or drug abuse patient.
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
## 2015-08-21 NOTE — Discharge Summary -  Counselor (Addendum)
[facility]   Discharge Summary    Patient Name: Jamie Dixon, Jamie Dixon  Date of Birth: 03-06-1981  Medical Record Number: [id number]  Level of Care: Inpatient  Admission Date: 08/17/2015.  Unit Discharge Date: 08/21/2015   Reason For Discharge: Treatment Completed    Initial Justification for Treatment:   Jamie Dixon is a 35 y.o. single Caucasian female presenting with diagnosis of Alcohol dependence with withdrawal with complication and treatment. This was patient's first treatment episode at [facility] and first treatment episode overall. Patient reports the presenting problem as alcohol use disorder. Pt's stated goal:" Stay sober & find a counselor".    Bio-Psychosocial Assessment 08/17/2015   Alcohol Use In Lifetime;In Past Twelve Months   Age first used alcohol:  68   Max used in a day 2 bottles wine   Pattern of use: current use was 1-2 bottlers of wine and she would share her boyfriend's liquor for the past 2 months; since 2013 she was drinking daily a bottle of wine with increase in amount in the past 2 years   Last use: 08/16/2015   Longest Abstinence:  10 days in March 2016   Tranquilizers/Benzodiazepines: klonopin-prescribed   Age first used: 50   Years of use: on and off   Pattern of use: denies abuse or current regular use   Last use: sometime within the past month   Tranquilizers/Benzodiazepines #2: xanax-prescribed   Age first used: (No Data)   Pattern of use: she takes it twice for sleep recently   Last use: month ago   Tranquilizers/Benzodiazepines #3 no   Heroin Use N/A   Rx Opiates #1:  prescribed a long time ago after surgery   Rx Opiates #2:  n/a   Rx Opiates #3:  n/a   Opiates Use #4: n/a   Opiates Use #5: n/a   Opiates Use #6: n/a   Non-Rx Methadone Use N/A   Amphetamine Use In Lifetime;Nasal   Age first used amphetamine: 17   Years of use: 0   Pattern of use: at 56 I dabbled in meth for a short time; she has used unprescribed adderal here and  there   Last use: 2 weeks ago   Marijuana Use In Lifetime;Smoked   Age first used Marijuana: 18   Years of use: 0   Pattern of use: tried in college   Cocaine Use In Lifetime;Nasal   Age first used cocaine: 18   Years of use: 0   Pattern of use: social use   Last use: 3 weeks ago   Hallucinogens Use In Lifetime;Oral   Hallucinogens info: acid once and mushrooms in college   Inhalants Use N/A   PCP Use N/A   Nicotine Use In Lifetime;Smoked   Additional information: social use   Barbiturates Use N/A   Other Substance Abuse Ketamine   Age first used: 30   Years of use: 0   Pattern of use: used x 2   Other Substance Abuse #2: ecstacy last March 2016   Other Substance Abuse #3: n/a   Caffeine use: occasional   Reason for Alcohol or Drug Use it started after she had a thyroidectomy 01/2012   Current Withdrawal Symptoms Anxiety;Fatigue;Depression;Tremors;Sweats;Chills   Past Withdrawal Symptoms Anxiety;Depression;Tremors;Sweats;Chills;Fatigue;Cravings;Insomnia;Nausea/vomiting   History of Hallucinations? No   History of Withdrawal Seizures? No   History of DT's? No   History of Blackouts? Yes   When were blackouts? when she was on Paxil  Summary of Significant Findings:  Upon discharge, patient does not report SI/HI .Patient reports current living situation is apartment and that she lived with significant other in [place], [place]. Patient denies this is a sober environment as both she had BF drank there. Patient reports she does not currently have a sponsor. Patient reports she participated in playing piano, writing & reading for leisure activities. Patient describes current social relationships as supportive parents and friends. Patient denies legal history. Patient reports highest   grade completed as Bachelor's degree. Patient is Currently employed as Production designer, theatre/television/film for a peer review journal. Patient does not report job consequences as a result of her  substance abuse though record reflects that pt did not elaborate. Patient does not report financial problems currently. Patient reports medical health issues as s/p thyroidectomy, h/o thyroid cancer & hypothyroidism. Patient reports last time she saw her medical provider was 06/2015.  Discharge Medication List      Taking         calcitRIOL 0.25 MCG capsule   Dose: 0.25 mcg   Commonly known as: ROCALTROL   Take 0.25 mcg by mouth 3 (three) times daily.       calcium citrate 950 MG tablet   Dose: 500 mg   Commonly known as: CALCITRATE   Take 500 mg by mouth 3 (three) times daily.       cephalexin 500 MG capsule   Dose: 500 mg   Commonly known as: KEFLEX   For: Urinary Tract Infection   Take 1 capsule (500 mg total) by mouth 2 (two) times daily.       QUEtiapine 50 MG tablet   Dose: 50 mg   Commonly known as: SEROquel   For: Depression   Take 1 tablet (50 mg total) by mouth nightly as needed (insomnia).       SYNTHROID PO   Dose: 175 mcg   Take 175 mcg by mouth.         STOP taking these medications         LORazepam 1 MG tablet   Commonly known as: ATIVAN                   Summary of Services Provided:   Pt. was detoxified with Librium protocol. Additional medical therapy included continued Seroquel, Keflex, Synthroid &Calcitrate. Pt. was individually assessed and treatment plans were developed. Pt. had been offered education on the disease process of addition, relapse behaviors, self-defeating learned behaviors, defense mechanisms and post acute withdrawal symptoms. Pt. was directed to attend all 12-step meetings and educational groups/lectures. Patient began inpatient level of care on 08/17/2015  3:06 AM. Patient was referred to this level of care by friends.  Patient made the following progress towards meeting treatment goals: pt attended all scheduled, completed the personal journal & step-1 the latter was presented to group. Record reflected that pt showed  improved insight into her alcoholism. Pt attended psycho-educational groups and 12-step meetings & plans to obtain a sponsor near her home.  Patient's behavior in group was appropriate and supportive. Pt's aftercare plan is day treatment. Pt reported that the lesson learned during admission was:"Accepting that drinking is not my only choice". Requested a back to work letter.Patient did involve her family in treatment as pt's mother completed family interview and friend attended the family education group.      Patient presented in the a contemplative stage of change as evidenced by improved insight into her addiction & willingness to make changes in  the immediate future.     Discharge Diagnosis:  Alcohol use disorder   Alcohol withdrawal   Depression  Anxiety  Hypothyroidism   H/O of thyroid cancer   Status post thyroidectomy 2013     Condition at discharge  Pt was medically stabilized at time of discharge    Continuing Care Plan:   Continue attending 12-step meetings and obtain a sponsor  Continue to take all medications, as directed  Tobacco Cessation: See AVS

## 2015-08-21 NOTE — Plan of Care (Signed)
Problem: Safe medical management of withdrawal from (list substances of abuse) AS EVIDENCED BY...  Goal: Compliance with management of withdrawal syndrome  Intervention: Assess withdrawal signs/symptoms according to identified protocol e.g. CIWA/COWS  Ativan 1mg  administered x 1 for c/o anxiety with good effect.  VSS.  Will continue to monitor.

## 2015-08-21 NOTE — Progress Notes (Signed)
Transition Note: Pt. was detoxified with Librium protocol. Additional medical therapy included continued Seroquel, Keflex, Synthroid &Calcitrate. Pt. was individually assessed and treatment plans were developed. Pt. had been offered education on the disease process of addition, relapse behaviors, self-defeating learned behaviors, defense mechanisms and post acute withdrawal symptoms. Pt. was directed to attend all 12-step meetings and educational groups/lectures. Patient began inpatient level of care on 08/17/2015 3:06 AM. Patient was referred to this level of care by friends. Patient made the following progress towards meeting treatment goals: pt attended all scheduled, completed the personal journal & step-1 the latter was presented to group. Record reflected that pt showed improved insight into her alcoholism. Pt attended psycho-educational groups and 12-step meetings & plans to obtain a sponsor near her home. Patient's behavior in group was appropriate and supportive. Pt's aftercare plan is day treatment. Pt reported that the lesson learned during admission was:"Accepting that drinking is not my only choice". Requested a back to work letter.Patient did involve her family in treatment as pt's mother completed family interview and friend attended the family education group.     Patient presented in the a contemplative stage of change as evidenced by improved insight into her addiction & willingness to make changes in the immediate future.

## 2015-08-22 ENCOUNTER — Ambulatory Visit: Payer: Commercial Managed Care - POS | Attending: Addiction Medicine

## 2015-08-22 VITALS — BP 117/62 | HR 104 | Temp 98.6°F | Resp 20 | Ht 67.0 in | Wt 120.0 lb

## 2015-08-22 DIAGNOSIS — R6889 Other general symptoms and signs: Secondary | ICD-10-CM

## 2015-08-22 DIAGNOSIS — F10239 Alcohol dependence with withdrawal, unspecified: Secondary | ICD-10-CM | POA: Insufficient documentation

## 2015-08-22 DIAGNOSIS — F419 Anxiety disorder, unspecified: Secondary | ICD-10-CM | POA: Insufficient documentation

## 2015-08-22 MED ORDER — ONDANSETRON 4 MG PO TBDP
4.0000 mg | ORAL_TABLET | ORAL | Status: DC | PRN
Start: 2015-08-22 — End: 2015-08-22

## 2015-08-22 MED ORDER — HYDROXYZINE PAMOATE 25 MG PO CAPS
25.0000 mg | ORAL_CAPSULE | Freq: Once | ORAL | Status: AC
Start: 2015-08-22 — End: 2015-08-22
  Administered 2015-08-22: 25 mg via ORAL
  Filled 2015-08-22: qty 1

## 2015-08-22 MED ORDER — IBUPROFEN 800 MG PO TABS
800.0000 mg | ORAL_TABLET | Freq: Four times a day (QID) | ORAL | Status: DC | PRN
Start: 2015-08-22 — End: 2015-08-22

## 2015-08-22 NOTE — Progress Notes (Signed)
Nursing admission note: Jamie Dixon arrived as a step-down from IP level of care for admission to [facility] for alcohol dependency. Pt denies relapse, reports last drink was on 08/15/2015. Pt reports she was previously prescribed Klonopin 1mg  once daily PRN for anxiety but has not had this medication for about a month and has no current refills or a filled Rx bottle. Pt denies cravings overnight. BAL negative, UDS negative. BP 131/76 mmHg  Pulse 111  Temp(Src) 98.6 F (37 C) (Oral)  Resp 18  Ht 1.702 m (5\' 7" )  Wt 54.432 kg (120 lb)  BMI 18.79 kg/m2  SpO2 99%  LMP 08/20/2015 Pt reports she has chronic high pulse rate since her thyroid removal d/t thyroid cancer. CIWA = 3 for fine tremors and mild anxiety. Pt reports she was diagnosed with anxiety, "a long time ago and sometimes I get panic attacks and other times, I feel fine. Right now, I feel fine." Pt was oriented to PHP schedule and policies. Appears pleasant and cooperative. Went right into group after meeting with MD.     Patient approached this writer at 1430 and presented as tearful and panicked. She reported she felt like she was having a panic attack after a triggering discussion in group r/t job stress and legal issues as pt is currently filing a discrimination case against her current employer. Emotional support provided as this Clinical research associate educated pt on deep breathing to help alleviate anxiety and improve oxygenation as pt was hyperventilating. O2 sat 100% on RA. Dr. Derryl Harbor was called and made aware of pt's panic attack, t/o received for one-time order of Vistaril 25mg  po for anxiety. BP 138/90 mmHg  Pulse 122  Temp(Src) 98.6 F (37 C) (Oral)  Resp 22  Ht 1.702 m (5\' 7" )  Wt 54.432 kg (120 lb)  BMI 18.79 kg/m2  SpO2 100%  LMP 08/20/2015 CIWA = 10 for panic attack, tremors and agitation. Pt was medicated with Vistaril 38m po @ 1440 (she is not driving to tx). Counseling staff member was requested by this Clinical research associate to meet with patient. 1:1 counseling  support provided to pt. She reported feeling better after a lengthy discussion with counselor and returned to group.    Jamie Dixon was reassessed at end of program day. BP 117/62 mmHg  Pulse 104  Temp(Src) 98.6 F (37 C) (Oral)  Resp 20  Ht 1.702 m (5\' 7" )  Wt 54.432 kg (120 lb)  BMI 18.79 kg/m2  SpO2 100%  LMP 08/20/2015 CIWA = 3. She reported feeling much better after Vistaril administration but did endorse mild drowsiness (pt is receiving a ride home from a peer). She thanked this Clinical research associate for support and was in good spirits, A&Ox4. She denied confusion or hallucinations. Jamie Dixon is scheduled to return to [facility] tomorrow morning @ 0900.

## 2015-08-22 NOTE — Progress Notes (Signed)
Topic: Multi-System Assessment of Addiction part1  Goal:  Discussing how Addiction affected one's life  Intervention:  Cognitive/behavioral restructuring    Group Comments/Observations:     Pt attended: Yes  Pt participation: active: shared     Notes:     Behavioral Observation: respectful, cooperative      Pt and peers participated in a discussion about how their addiction affected all areas of their life. The topics were: How addiction impacted my body, What plans are in plans to eat healthier and rest properly?    The group spent the entire group working on this assignment.     Pt was provided with support of peers and staff.      Mariel Kansky MA N[facility] LPC

## 2015-08-22 NOTE — Progress Notes (Signed)
Family Note:    Pt signed a release of information for her mother and limited disclosures to emergency contact and or admission to the program. Pt's family interview is deferred until such time as she consents to same.     Charlaine Dalton. Kimberley Speece, LPC, MAC, NCC, CSAC
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
## 2015-08-22 NOTE — Progress Notes (Signed)
Topic: Multi-System Assessment of Addiction part1  Goal:  Discussing how Addiction affected one's life  Intervention:  Cognitive/behavioral restructuring    Group Comments/Observations:     Pt attended: Yes  Pt participation: active: shared     Notes:     Behavioral Observation: respectful, cooperative      Pt and peers participated in a discussion about how their addiction affected all areas of their life. The topics were: How addiction impacted my body, What plans are in plans to eat healthier and rest properly?    The pt shared how her body had been impacted by her drinking and how she had needed to continue to eat healthier, vegan, and hang out with healthy friends and take proper medications, go to the dentist and see her PCP.     Pt was provided with support of peers and staff.      Mariel Kansky MA NCC LPC

## 2015-08-22 NOTE — Progress Notes (Signed)
Topic: [employer]/[place]        Goal:  Build Rapport/establish relationship between therapist and patients      Intervention:  person centered      Group Comments/Observations:     Pt attended: Yes  Pt participation: active     Notes:     Suicide Assessment Tool (SAT)    Thoughts (Suicidal Ideation): verbalizes no current ideation  Plans (Suicidal Ideation): none to occasional thoughts of suicide with no plan  Method (Suicidal Ideation): none  Impulse Control (Behavior Cues): adequate impulse control  Behavioral Activity (Behavior Cues): consistent in behavior or patterns  Preparation for Death (Behavior Cues): none  Predominant Mood or Affect: signs of mild depression or indicators of hopefulness  Mood Stability (Mood/Affect): mood stable  Tolerance of Feelings (Mood/Affect): feelings tolerable  Problem Solving (Cognition/Perception): problem solving intact  Perception (Cognition/Perception): accurate perception of reality  Monitoring/Suicide Alert Level: Routine monitoring    Behavioral Observation: Attentive        Therapist casually inquired about patient's present condition. General inquires included how pt was doing in their recovery journey, what their previous evening entailed, how they were progressing with their family and other significant relationships and other concerns that were impacting their sobriety.     Patient responded to questions and engaged in collaborative conversation. Pt shared that she had a good night with her friend who took her to dinner and grocery shopping and watched a movie.       Mariel Kansky MA [facility] NCC

## 2015-08-22 NOTE — Progress Notes (Signed)
PROGRESS NOTE    Date Time: 08/22/2015 10:44 AM  Patient Name: Jamie Dixon, Jamie Dixon.      Chief Complaint:   Alcohol withdrawal  "I am doing OK, I am staying with a friend , I have no cravings"      Assessment:   Alcohol Use Disorder  Alcohol Withdrawal  Anxiety  Hypocalcemia          Plan:   Admit to day treatment   [facility]  Declined to start on Revia            Medications:     Current Facility-Administered Medications   Medication Dose Route Frequency       Review of Systems:   A comprehensive review of systems was negative except for:   Respiratory: Negative  Cardiovascular: Negative  Gastrointestinal: Negative  Genitourinary: Negative  Musculoskeletal: Negative  Neurological:Negative    Physical Exam:     Filed Vitals:    08/22/15 0921   BP: 131/76   Pulse: 111   Temp: 98.6 F (37 C)   Resp: 18   SpO2: 99%       General appearance - oriented to person, place, and time and anxious  Mental status -  Anxious mood, guarded  Eyes - pupils equal and reactive, extraocular eye movements intact  Ears - bilateral TM's and external ear canals normal  Nose - no septal perforation  Mouth - mucous membranes moist, pharynx normal without lesions  Neck - supple, no significant adenopathy  Chest - clear to auscultation, no wheezes, rales or rhonchi, symmetric air entry  Heart - normal rate, regular rhythm, normal S1, S2, no murmurs, rubs, clicks or gallops  Abdomen - soft, nontender, nondistended, no masses or organomegaly  Neurological - alert, oriented, normal speech, no focal findings or movement disorder noted, screening mental status exam normal, neck supple without rigidity, cranial nerves II through XII intact, motor and sensory grossly normal bilaterally, normal muscle tone, mild tremors  Musculoskeletal - no joint tenderness, deformity or swelling  Extremities - peripheral pulses normal, no pedal edema, no clubbing or cyanosis  Skin - normal      Labs:     Results     ** No results found for the last 24 hours. **               Signed by: Jacqualyn Posey, MD
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
## 2015-08-22 NOTE — [facility] Treatment History (Signed)
CHEMICAL DEPENDENCE AND MENTAL HEALTH TREATMENT HISTORY  INCLUDE BOTH INPATIENT AND OUTPATIENT TREATMENT ATTEMPTS            Facility Name Dates Presenting Problem Outcome/Years Sober   [facility] Inpatient 08/16/2015 Alcohol No relapse   [facility] 08/22/2015 Alcohol

## 2015-08-23 ENCOUNTER — Ambulatory Visit (HOSPITAL_PSYCHIATRIC): Payer: Commercial Managed Care - POS

## 2015-08-23 VITALS — BP 129/81 | HR 96 | Temp 98.5°F | Resp 18

## 2015-08-23 DIAGNOSIS — R6889 Other general symptoms and signs: Secondary | ICD-10-CM

## 2015-08-23 MED ORDER — IBUPROFEN 800 MG PO TABS
800.0000 mg | ORAL_TABLET | Freq: Four times a day (QID) | ORAL | Status: DC | PRN
Start: 2015-08-23 — End: 2015-08-24

## 2015-08-23 MED ORDER — ONDANSETRON 4 MG PO TBDP
4.0000 mg | ORAL_TABLET | ORAL | Status: DC | PRN
Start: 2015-08-23 — End: 2015-08-24

## 2015-08-23 MED ORDER — HYDROXYZINE PAMOATE 25 MG PO CAPS
25.0000 mg | ORAL_CAPSULE | Freq: Once | ORAL | Status: AC
Start: 2015-08-23 — End: 2015-08-23
  Administered 2015-08-23: 25 mg via ORAL
  Filled 2015-08-23: qty 1

## 2015-08-23 MED ORDER — HYDROXYZINE PAMOATE 25 MG PO CAPS
25.0000 mg | ORAL_CAPSULE | ORAL | Status: DC | PRN
Start: 2015-08-23 — End: 2015-08-24
  Administered 2015-08-23: 25 mg via ORAL
  Filled 2015-08-23: qty 1

## 2015-08-23 NOTE — Progress Notes (Signed)
Topic: Relapse prevention worksheet   Goal:  Identification of cognitive distortions, Positive behavioral changes  Intervention:  Cognitive/behavioral restructuring. Experiential     Group Comments/Observations:     Pt attended: Yes  Pt participation: active: shared     Notes:     Behavioral Observation:       Pt and peers participated in task group where they were assigned to identify coping skills they could utilize instead of using drugs, social connectedness that promoted recovery and consequences of relapse and of sobriety side by side. Pt's presented and processed these tasks.        Pt received positive feedback and support from peers and staff.    Mariel Kansky MA [facility] NCC

## 2015-08-23 NOTE — Progress Notes (Signed)
Topic: Touch-Base/Round Robin        Goal:  Build Rapport/establish relationship between therapist and patients      Intervention:  person centered      Group Comments/Observations:     Pt attended: Yes  Pt participation: active     Notes:     Suicide Assessment Tool (SAT)    Thoughts (Suicidal Ideation): verbalizes no current ideation  Plans (Suicidal Ideation): none to occasional thoughts of suicide with no plan  Method (Suicidal Ideation): none  Impulse Control (Behavior Cues): adequate impulse control  Behavioral Activity (Behavior Cues): consistent in behavior or patterns  Preparation for Death (Behavior Cues): none  Predominant Mood or Affect: signs of mild depression or indicators of hopefulness  Mood Stability (Mood/Affect): mood stable  Tolerance of Feelings (Mood/Affect): feelings tolerable  Problem Solving (Cognition/Perception): problem solving intact  Perception (Cognition/Perception): accurate perception of reality  Monitoring/Suicide Alert Level: Routine monitoring    Behavioral Observation: antsy, anxious       Therapist casually inquired about patient's present condition. General inquires included how pt was doing in their recovery journey, what their previous evening entailed, how they were progressing with their family and other significant relationships and other concerns that were impacting their sobriety.     Patient responded to questions and engaged in collaborative conversation. Pt shared that she went shopping with a friend from group and did not have the urge to drink. Pt reported that she was staying with a friend and working on a to do list that included figuring out her short term disability options. Pt noted that she felt positive and hopeful today.      Charlaine Dalton. Faith Patricelli LPC, MAC, NCC, CSAC

## 2015-08-23 NOTE — Progress Notes (Signed)
Arrived on time to [facility].  Denies relapse/cravings.  BAL and UDS negative.  Pt extremely anxious on arrival, perseverating on letter from the doctor for her work.  Pt observed crying in first group and came out reporting she feels "claustrophobic".  MD called for Vistaril order.  Vistaril 25 mg po administered at 0958.  Pt appears to be motivated and engaged with peers.  Pt informed that insurance only approved 3 days in [facility] so tomorrow 1/19 will be her last covered day.  Pt expressed interest in [facility] in .      Pt assessed at end of the day.  Denied SI/HI.  Alert and oriented x4, no confusion or hallucinations.  BP 129/81 mmHg  Pulse 96  Temp(Src) 98.5 F (36.9 C) (Oral)  Resp 18  LMP 08/20/2015.  Pt still visibly anxious and requested another Vistaril.  Stated she 1/5 cravings "but will not drink tonight".  Vistaril 25 mg po administered at 1521.  Pt stated that she is not driving, "I have a license but no car".  Pt scheduled to return to [facility] tomorrow morning.

## 2015-08-23 NOTE — Progress Notes (Signed)
PROGRESS NOTE    Date Time: 08/23/2015 11:14 AM  Patient Name: Jamie Dixon, Jamie Dixon.      Chief Complaint:   Alcohol withdrawal  "I am doing OK"    Assessment:   Alcohol Use Disorder  Alcohol Withdrawal  Anxiety  Hypocalcemia          Plan:   Continue day treatment   Vistaril PRN for anxiety   SA Counseling  Counseling about aftercare  Possible discharge tomorow           Medications:     Current Facility-Administered Medications   Medication Dose Route Frequency       Review of Systems:   A comprehensive review of systems was negative except for:   Respiratory: Negative  Cardiovascular: Negative  Gastrointestinal: Negative  Genitourinary: Negative  Musculoskeletal: Negative  Neurological:Negative    Physical Exam:     Filed Vitals:    08/23/15 0917   BP: 112/75   Pulse: 96   Temp: 98.5 F (36.9 C)   Resp: 18       General appearance - oriented to person, place, and time and anxious  Mental status -  Anxious mood, guarded  Eyes - pupils equal and reactive, extraocular eye movements intact  Ears - bilateral TM's and external ear canals normal  Nose - no septal perforation  Mouth - mucous membranes moist, pharynx normal without lesions  Neck - supple, no significant adenopathy  Chest - clear to auscultation, no wheezes, rales or rhonchi, symmetric air entry  Heart - normal rate, regular rhythm, normal S1, S2, no murmurs, rubs, clicks or gallops  Abdomen - soft, nontender, nondistended, no masses or organomegaly  Neurological - alert, oriented, normal speech, no focal findings or movement disorder noted, screening mental status exam normal, neck supple without rigidity, cranial nerves II through XII intact, motor and sensory grossly normal bilaterally, normal muscle tone, mild tremors  Musculoskeletal - no joint tenderness, deformity or swelling  Extremities - peripheral pulses normal, no pedal edema, no clubbing or cyanosis  Skin - normal      Labs:     Results     ** No results found for the last 24 hours. **               Signed by: Jacqualyn Posey, MD

## 2015-08-23 NOTE — Progress Notes (Signed)
Topic:  Wellness Recovery Action Plan   Goal:  Identification of cognitive distortions, Increased mindfulness, Positive behavioral changes  Intervention:  Cognitive/behavioral restructuring    Patient Before Group Check-In:   (Reader is referred to scanned document in Chart with patient signature.)    Counseling Summary:       Pt and peers participated in sober planning group. Topics included abstinence plan, ways to exercise one's body and mind, something to do to invest in one's recovery, development of connection to others, seeking a greater understanding of self and or others, and something positive that was done for one's recovery.        Pt stated she would relax, meditate and sleep and spend time with a few good friends she is very close to.     Pt denied SI/HI.     Mariel Kansky MA NCC LPC

## 2015-08-23 NOTE — Progress Notes (Signed)
Process Group  Counseling Group Notes    Patient Name:  Kallan Bischoff. Acord [[id number]] DOB: 29-Jul-1981  Date:  08/23/2015    Topic:  Understanding Boundaries    Goal:   To increase understanding of healthy and unhealthy personal boundaries    Intervention:  interactive didactic discussion       Group Comments/Observations:      Pt attended: yes   Pt participation: active     Notes:      Behavioral Observation: engaged, cooperative     Pt and peers participated in educational discussion about personal physical, emotional and spiritual boundaries as they apply to recovery from addiction.   Defined, discussed and shared examples of Rigid Boundaries, No Boundary, Partial and Healthy Boundaries.  Defined, discussed and shared identifying skills for each individual to demonstrate healthy boundaries.   Pt then took park in a self-assessment task identifying characteristics of their own boundaries.    Pt demonstrated adequate understanding of boundaries as they related to substance abuse as evidenced by questions asked, support provided.    Dan Europe, MS, NCC

## 2015-08-23 NOTE — Progress Notes (Signed)
Topic: Values   Goal:  Increased mindfulness, Positive behavioral changes  Intervention:  Existential Counseling/Logo Therapy     Group Comments/Observations:     Pt attended: Yes  Pt participation: active: shared     Notes:     Behavioral Observation: quiet, anxious       Pt participated in exercise of identification of personal values, how drinking and or other drugs interfered with these values, how to reconnect with their values and finding accountability.     Pt shared that she wanted to have stability in her family, career finances and health. She wished to live in harmony with others. Pt noted plan to work with counselors and engage in not only her career but her passions. Pt reported she had friends that were supportive and willing to help her with her goals and she counted them as her family.       Charlaine Dalton. [place], LPC, MAC, NCC, CSAC

## 2015-08-24 ENCOUNTER — Ambulatory Visit (HOSPITAL_PSYCHIATRIC): Payer: Commercial Managed Care - POS

## 2015-08-24 ENCOUNTER — Other Ambulatory Visit: Payer: Self-pay

## 2015-08-24 VITALS — BP 117/74 | HR 93 | Temp 99.0°F | Resp 16

## 2015-08-24 DIAGNOSIS — R6889 Other general symptoms and signs: Secondary | ICD-10-CM

## 2015-08-24 DIAGNOSIS — F10939 Alcohol use, unspecified with withdrawal, unspecified: Secondary | ICD-10-CM

## 2015-08-24 DIAGNOSIS — F1020 Alcohol dependence, uncomplicated: Secondary | ICD-10-CM

## 2015-08-24 DIAGNOSIS — Z7141 Alcohol abuse counseling and surveillance of alcoholic: Secondary | ICD-10-CM

## 2015-08-24 DIAGNOSIS — F10239 Alcohol dependence with withdrawal, unspecified: Secondary | ICD-10-CM

## 2015-08-24 DIAGNOSIS — F419 Anxiety disorder, unspecified: Secondary | ICD-10-CM

## 2015-08-24 DIAGNOSIS — F329 Major depressive disorder, single episode, unspecified: Secondary | ICD-10-CM

## 2015-08-24 MED ORDER — ONDANSETRON 4 MG PO TBDP
4.0000 mg | ORAL_TABLET | ORAL | Status: DC | PRN
Start: 2015-08-24 — End: 2015-08-24

## 2015-08-24 MED ORDER — HYDROXYZINE PAMOATE 25 MG PO CAPS
25.0000 mg | ORAL_CAPSULE | Freq: Three times a day (TID) | ORAL | 0 refills | Status: AC | PRN
Start: 2015-08-24 — End: ?
  Filled 2015-08-24: qty 20, 7d supply, fill #0

## 2015-08-24 MED ORDER — HYDROXYZINE PAMOATE 25 MG PO CAPS
25.0000 mg | ORAL_CAPSULE | ORAL | Status: DC | PRN
Start: 2015-08-24 — End: 2015-08-24

## 2015-08-24 MED ORDER — IBUPROFEN 800 MG PO TABS
800.0000 mg | ORAL_TABLET | Freq: Four times a day (QID) | ORAL | Status: DC | PRN
Start: 2015-08-24 — End: 2015-08-24

## 2015-08-24 NOTE — Progress Notes (Addendum)
Topic: Touch-Base/Round Robin        Goal:  Build Rapport/establish relationship between therapist and patients      Intervention:  person centered      Group Comments/Observations:     Pt attended: Yes  Pt participation: active     Notes:     Suicide Assessment Tool (SAT)    Thoughts (Suicidal Ideation): verbalizes no current ideation  Plans (Suicidal Ideation): none to occasional thoughts of suicide with no plan  Method (Suicidal Ideation): none  Impulse Control (Behavior Cues): adequate impulse control  Behavioral Activity (Behavior Cues): consistent in behavior or patterns  Preparation for Death (Behavior Cues): none  Predominant Mood or Affect: signs of mild depression or indicators of hopefulness  Mood Stability (Mood/Affect): mood stable  Tolerance of Feelings (Mood/Affect): feelings tolerable  Problem Solving (Cognition/Perception): problem solving intact  Perception (Cognition/Perception): accurate perception of reality  Monitoring/Suicide Alert Level: Routine monitoring    Behavioral Observation:attentive    Therapist casually inquired about patient's present condition. General inquires included how pt was doing in their recovery journey, what their previous evening entailed, how they were progressing with their family and other significant relationships and other concerns that were impacting their sobriety.     Patient responded to questions and engaged in collaborative conversation. Pt shared that she ate healthy with her girlfriend and had a good night relaxing.        Mariel Kansky MA [facility] NCC

## 2015-08-24 NOTE — Progress Notes (Signed)
[facility] Nursing Discharge and Collaborative Plan       Date:  08/24/2015  Time: 1:10 PM  Patient Name:  Jamie Dixon  35 y.o.   04-08-1981      Date of Admission:  1/17//2017  Discharge Physician: Dr. Richardson Chiquito     Labs:     Lab Results   Component Value Date    ALT 47 08/19/2015    WBC 5.42 08/16/2015    RBC 4.14* 08/16/2015    HGB 13.6 08/16/2015    HCT 40.6 08/16/2015    MCV 98.1 08/16/2015    PLT 238 08/16/2015    AST 69* 08/19/2015    ALB 3.7 08/19/2015    MG 1.6 08/16/2015       Vaccination Results:  Immunization History   Administered Date(s) Administered   . PPD Test 08/17/2015     Pneumococcal:  Not Indicated  Influenza:  Negative  Administration date: 08/17/2015   TB Test:  Not Tested        Discharged Medication(s): ** Include reasons for prescribed**       Neale Burly, Amerah C.   Home Medication Instructions HAR:[id number]    Printed on:08/24/15 1310   Medication Information                      Vistaril 25mg . Take one by mouth every 8 hours as needed for anxiety.             Continue home medications.      Disposition/aftercare/Referral Plan:     Follow-up plan for sobriety was discussed with the patient.    Discharge Instructions:     Discharge instructions given to [facility].    Questions that may arise between hospital discharge and your first follow-up appointment should be directed to Inpatient [facility] [phone].    In event that you have a seizure upon discharge please call 911 and/or proceed to the nearest hospital.    Discharge Plan:     Referrals:  Comments   Continued Care Program  Phone #:  no  None         Psychiatrist  Phone #: [phone] yes Dr. Lum Keas  [facility]  [place], [place].  Patient will call to schedule an appointment   Medical Appointment  Phone #: no        TOPIC INSTRUCTION METHOD OUTCOME COMMENTS   DIET General  Discussion Learned Eat a well balanced diet.   SAFETY  Potential Seizures  Discussion Learned If you have any  problems call 911 or go to an emergency room.     Patient Signature_____________________________ date 08/24/2015  1:10 PM    Nurse Signature________________________________date 08/24/2015 1:10 PM    Tresa Endo RN  08/24/2015  1:10 PM

## 2015-08-24 NOTE — Progress Notes (Signed)
Topic: Journey through drug life and hopes for recovery Part 2  Goal: Increase Motivation and Understanding of Strengths and or Abilities  Intervention:  Creative Therapy    Group Comments/Observations:     Pt attended: Yes  Pt participation: active: shared     Notes:     Behavioral Observation: respectful, cooperative      The group worked on an exercise of creatively expressing their journey through addiction and what their hopes for recovery were. The pt's worked on the exercise the entire group.           Pt was provided with support of peers and staff.        Mariel Kansky MA [facility] LPC

## 2015-08-24 NOTE — Progress Notes (Signed)
PROGRESS NOTE    Date Time:   08/24/2015      9:28 AM  Patient Name: Jamie Dixon, Jamie Dixon.      Chief Complaint:   Doing well  Plans on discharge  Denies relapse      Assessment:   Alcohol Use Disorder  Alcohol Withdrawal  Anxiety  Hypocalcemia          Plan:   Discharge today  Vistaril PRN for anxiety   SA Counseling  Counseling about aftercare            Medications:     Current Facility-Administered Medications   Medication Dose Route Frequency       Review of Systems:   A comprehensive review of systems was negative except for:   Respiratory: Negative  Cardiovascular: Negative  Gastrointestinal: Negative  Genitourinary: Negative  Musculoskeletal: Negative  Neurological:Negative    Physical Exam:     Filed Vitals:    08/24/15 0921   BP: 109/61   Pulse: 97   Temp: 99 F (37.2 C)   Resp: 16       General appearance - oriented to person, place, and time  Mental status -  Anxious   Eyes - pupils equal and reactive, extraocular eye movements intact  Ears - bilateral TM's and external ear canals normal  Nose - no septal perforation  Mouth - mucous membranes moist, pharynx normal without lesions  Neck - supple, no significant adenopathy  Chest - clear to auscultation, no wheezes, rales or rhonchi, symmetric air entry  Heart - normal rate, regular rhythm, normal S1, S2, no murmurs, rubs, clicks or gallops  Abdomen - soft, nontender, nondistended, no masses or organomegaly  Neurological - alert, oriented, normal speech, no focal findings or movement disorder noted, screening mental status exam normal, neck supple without rigidity, cranial nerves II through XII intact, motor and sensory grossly normal bilaterally, normal muscle tone, mild tremors  Musculoskeletal - no joint tenderness, deformity or swelling  Extremities - peripheral pulses normal, no pedal edema, no clubbing or cyanosis  Skin - normal      Labs:     Results     ** No results found for the last 24 hours. **              Signed by: Nicholaus Bloom,  MD

## 2015-08-24 NOTE — Progress Notes (Signed)
Topic: Journey through drug life and hopes for recovery Part 1  Goal: Increase Motivation and Understanding of Strengths and or Abilities  Intervention:  Creative Therapy    Group Comments/Observations:     Pt attended: Yes  Pt participation: active: shared     Notes:     Behavioral Observation: respectful, cooperative      The group worked on an exercise of creatively expressing their journey through addiction and what their hopes for recovery were. The pt's worked on the exercise the entire group.           Pt was provided with support of peers and staff.        Mariel Kansky MA [facility] LPC

## 2015-08-24 NOTE — Progress Notes (Signed)
Jamie Dixon attended remainder of groups throughout rest of the day. She was assessed at end of program day. BP 117/74 mmHg  Pulse 93  Temp(Src) 99 F (37.2 C) (Oral)  Resp 16  LMP 08/20/2015 CIWA = 2 for fine tremors and mild anxiety. Pt was discharged home per MD order. Discharge instructions and medications reviewed with patient. Jamie Dixon plans to f/u with psychiatrist, Jamie Dixon [phone]) with [facility] in [place] on Monday, 08/28/15 @ 1600. ED pharmacy delivered pt's Rx for Vistaril 25mg  (#20 tablets). Medication teaching provided. Pt was in good spirits at end of program day. Reported she would be staying at her friend, Jamie Dixon's house until she finds her own housing. Pt denied SI/HI. Appeared A&Ox4, denied confusion or hallucinations. She left alert at end of the day with Vistaril Rx and discharge instructions.

## 2015-08-24 NOTE — Progress Notes (Signed)
Jamie Dixon arrived on time to [facility]. Reported she got a great night's sleep. Denied cravings or relapse. BAL negative, UDS negative. BP 109/61 mmHg  Pulse 97  Temp(Src) 99 F (37.2 C) (Oral)  Resp 16  LMP 08/20/2015 CIWA = 3 for fine tremors and mild anxiety. Pt appears to be in good spirits. Plans to discharge today and f/u with psychiatrist, Dr. Lum Keas [phone]. Pt went into groups. Dr. Richardson Chiquito written Rx for Vistaril 25mg  (#20).

## 2015-08-24 NOTE — Progress Notes (Signed)
Topic:  Wellness Recovery Action Plan   Goal:  Identification of cognitive distortions, Increased mindfulness, Positive behavioral changes  Intervention:  Cognitive/behavioral restructuring    Patient Before Group Check-In:   (Reader is referred to scanned document in Chart with patient signature.)    Counseling Summary:       Pt and peers participated in sober planning group. Topics included abstinence plan, ways to exercise one's body and mind, something to do to invest in one's recovery, development of connection to others, seeking a greater understanding of self and or others, and something positive that was done for one's recovery.        Pt stated she would go to her PO box for her abstinence plan tonight. Pt reported she would exercise her body and mind by going to a meeting. Pt indicated that she would not drink as something specific for her recovery tonight. Pt stated she would connect with others by hanging with a friend. Pt reported she would seek a better understanding of self and others by meditation.  Pt indicated that she felt good about create project today.        Pt denied SI/HI.     Charlaine Dalton. Ranyia Witting LPC, MAC, NCC, CSAC
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
## 2015-08-24 NOTE — Progress Notes (Signed)
Topic: Identifying positive aspects of sobriety   Goal:  Increase Motivation and Understanding of Possibilities of Sober Lifestyle   Intervention:  Creative Therapy     Group Comments/Observations:     Pt attended: Yes  Pt participation: active: shared     Notes:     Behavioral Observation: cooperative      Pt participated in exercise of expressing their problems due to drinking or using drugs and their best hopes for recovery. Pt and peers were invited to create a collage, draw or use other sources of self-expression to represent their addictions and aspirations for sobriety.     Pt created a collage and shared an abstract impression of herself. Pt noted her collage was off the paper and she was coloring outside the lines. She seemed to have the desire to express her individuality and clarified that her mother always tried to place her in a box. Pt expressed hopes of being a strong woman and living a healthy happy life.        Pt received positive feedback from peers and staff      Casimiro Needle B. Catelynn Sparger, LPC, MAC, NCC, CSAC

## 2015-09-09 NOTE — ED Provider Notes (Signed)
Addendum to note from 08/16/2015    EKG Interpretation    Rate: Tachycardic  Rhythm: sinus tachycardia  Axis: Normal  ST-T Segments: no ST elevation   Conduction: No blocksNo blocks  Impression: Non-specific EKG    EKG interpreted by ER MD      Paula Compton, PA  09/09/15 1936

## 2016-04-25 IMAGING — MG MM DIAG BREAST TOMO BILATERAL
8 of 12 series · 8 of 28 positions shown · non-contrast
Comparison: Previous exam(s).

CLINICAL DATA: Short-term interval followup of a probable
fibroadenoma in the right breast.

EXAM:
2D DIGITAL DIAGNOSTIC BILATERAL MAMMOGRAM WITH CAD AND ADJUNCT TOMO
ULTRASOUND RIGHT BREAST

[L CC synth-2D]
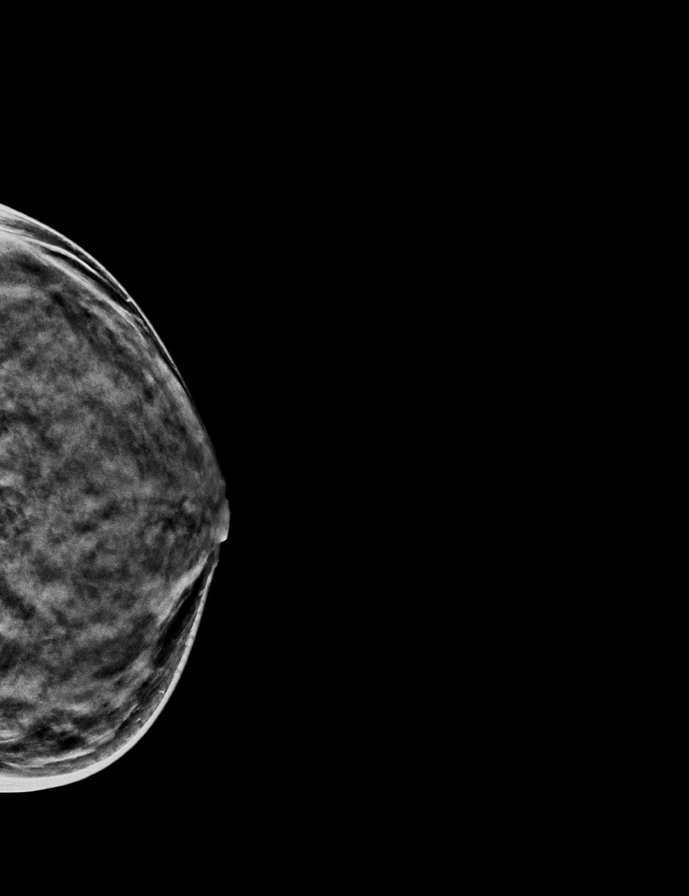

[L CC]
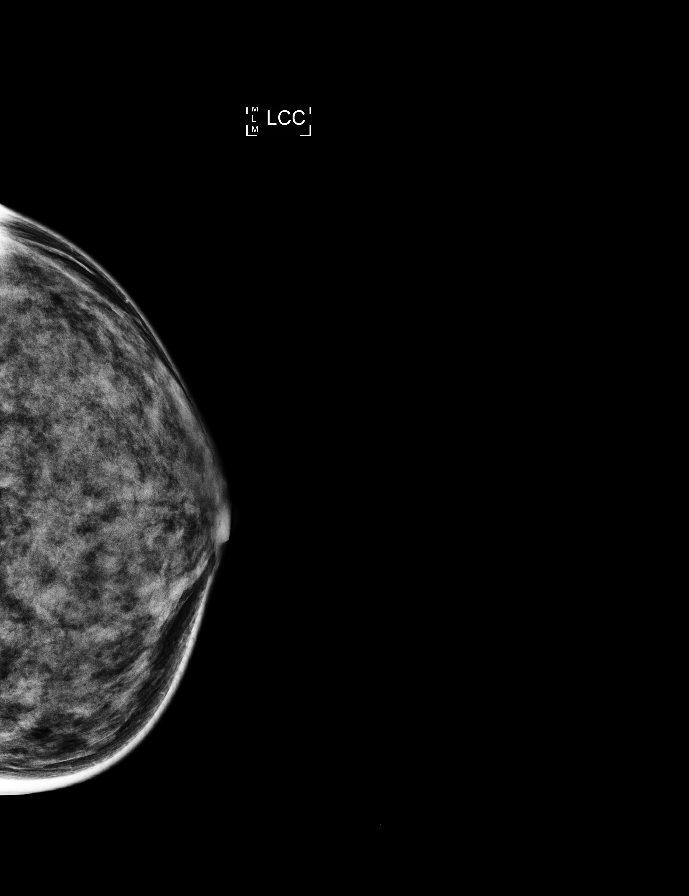

[R MLO synth-2D]
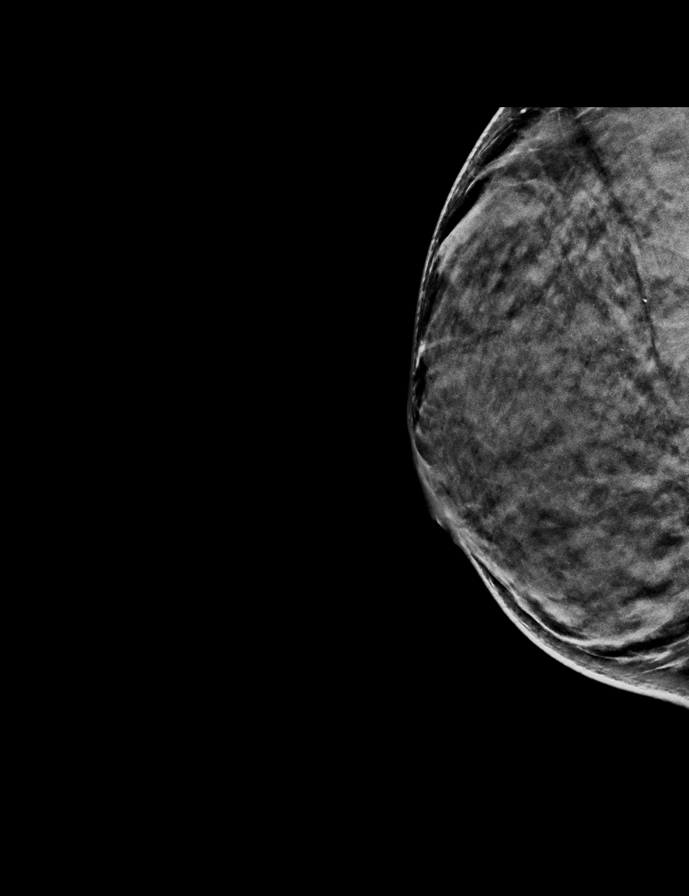

[R CC synth-2D]
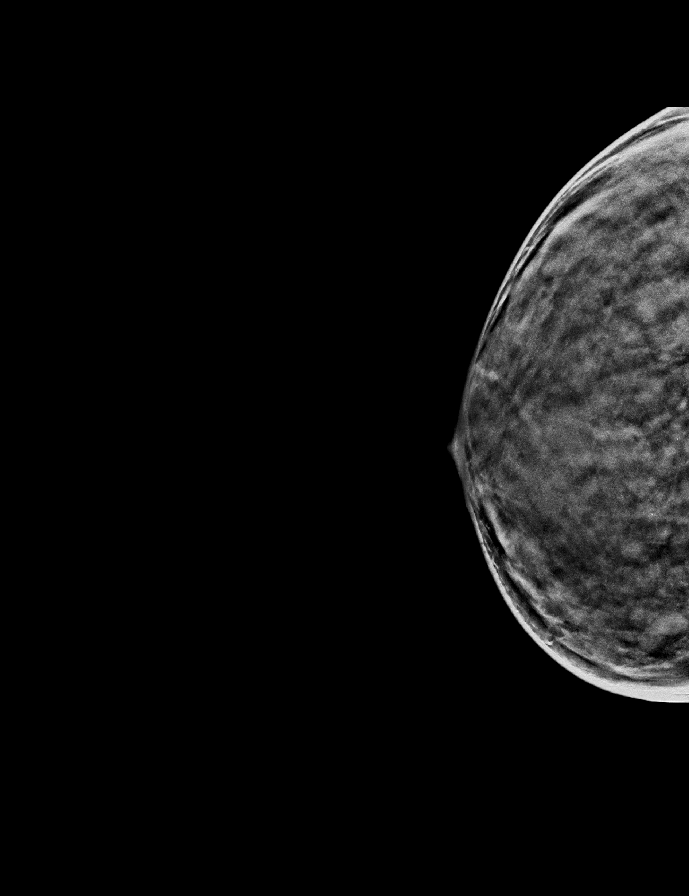

[R CC]
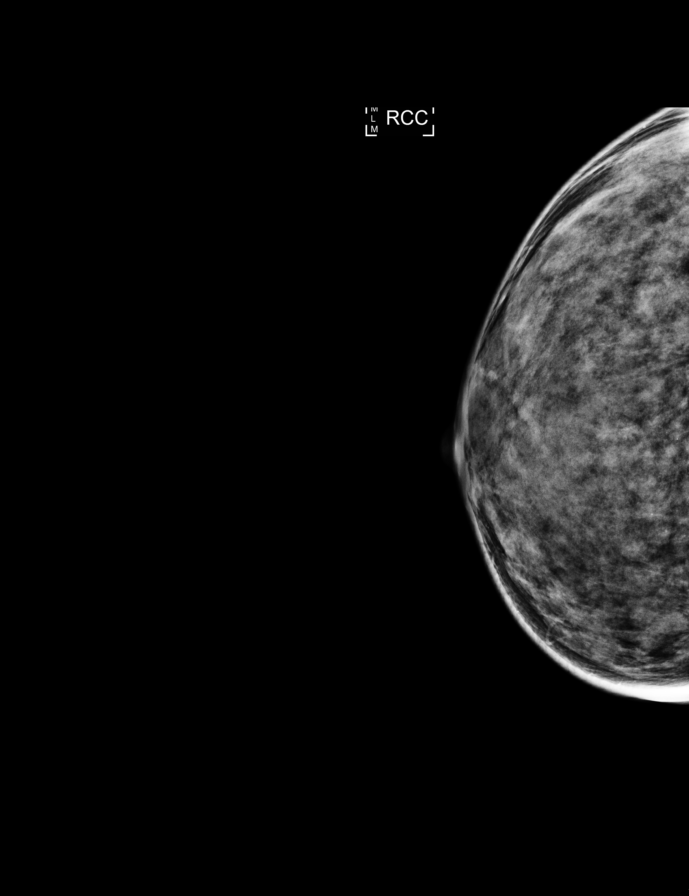

[L MLO]
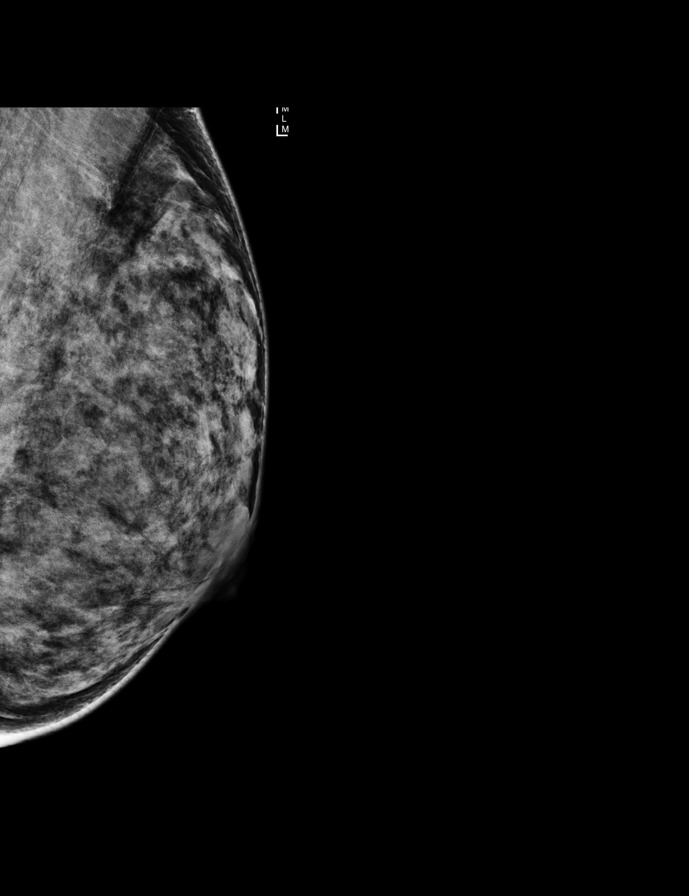

[L MLO synth-2D]
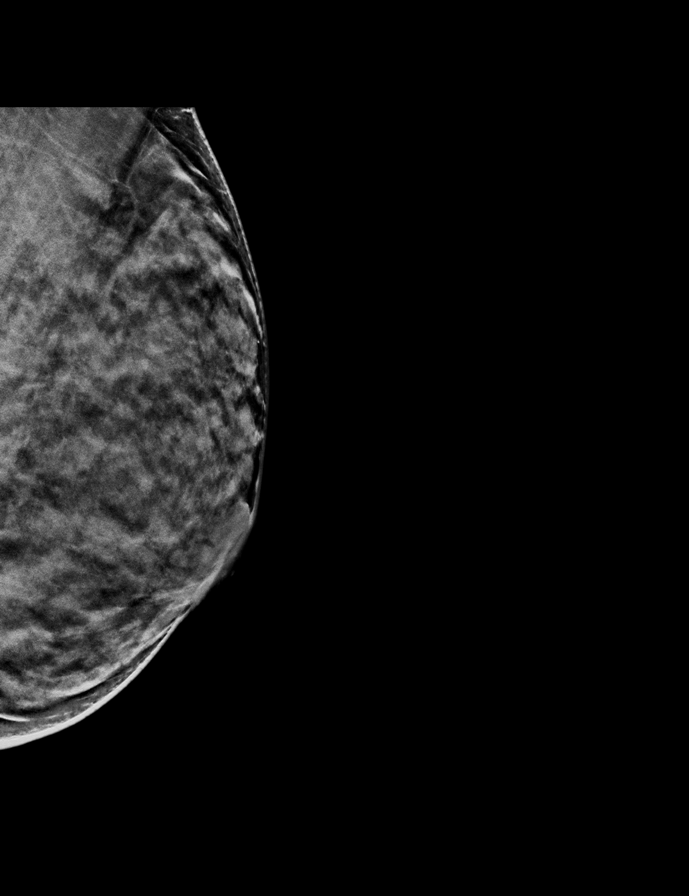

[R MLO]
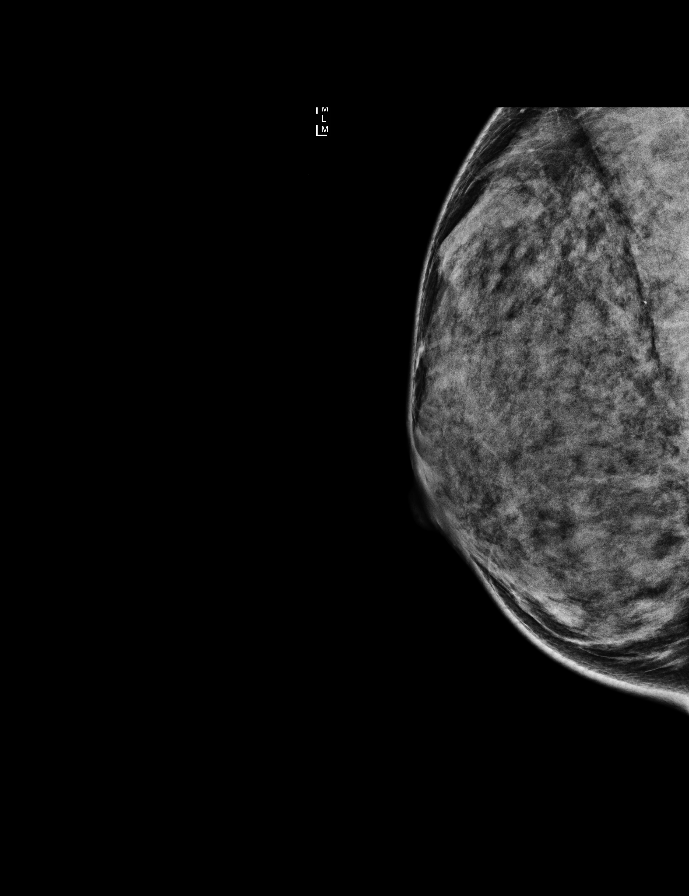

[8 of 28 positions shown; findings below may reference images not displayed]

ACR Breast Density Category d: The breast tissue is extremely dense,
which lowers the sensitivity of mammography.
FINDINGS: No suspicious mass, malignant type microcalcifications or distortion
detected in either breast.

Mammographic images were processed with CAD.

On physical exam, I palpate a discrete mass in the right breast at 1
o'clock 3 cm from the nipple.

Targeted ultrasound is performed, showing there is a
well-circumscribed hypoechoic mass in the right breast at 1 o'clock
3 cm from the nipple measuring 1.6 x 0.8 x 1.3 cm. Previously
08/29/2014 it measured 1.9 x 0.8 x 1.5 cm.
IMPRESSION: Stable probable fibroadenoma in the right breast.

RECOMMENDATION:
Bilateral diagnostic mammogram and right breast ultrasound in 1 year
is recommended to document 2 year stability. The importance of
self-breast examination was discussed with the patient.

I have discussed the findings and recommendations with the patient.
Results were also provided in writing at the conclusion of the
visit. If applicable, a reminder letter will be sent to the patient
regarding the next appointment.

BI-RADS CATEGORY  3: Probably benign.

## 2016-04-25 IMAGING — US US BREAST 10
1 series · 4 of 4 positions shown · non-contrast
Comparison: Previous exam(s).

CLINICAL DATA: Short-term interval followup of a probable
fibroadenoma in the right breast.

EXAM:
2D DIGITAL DIAGNOSTIC BILATERAL MAMMOGRAM WITH CAD AND ADJUNCT TOMO
ULTRASOUND RIGHT BREAST

[Series 1: us breast 10 · 0.06mm/px · 4 of 4 slices shown]
[im 1/4]
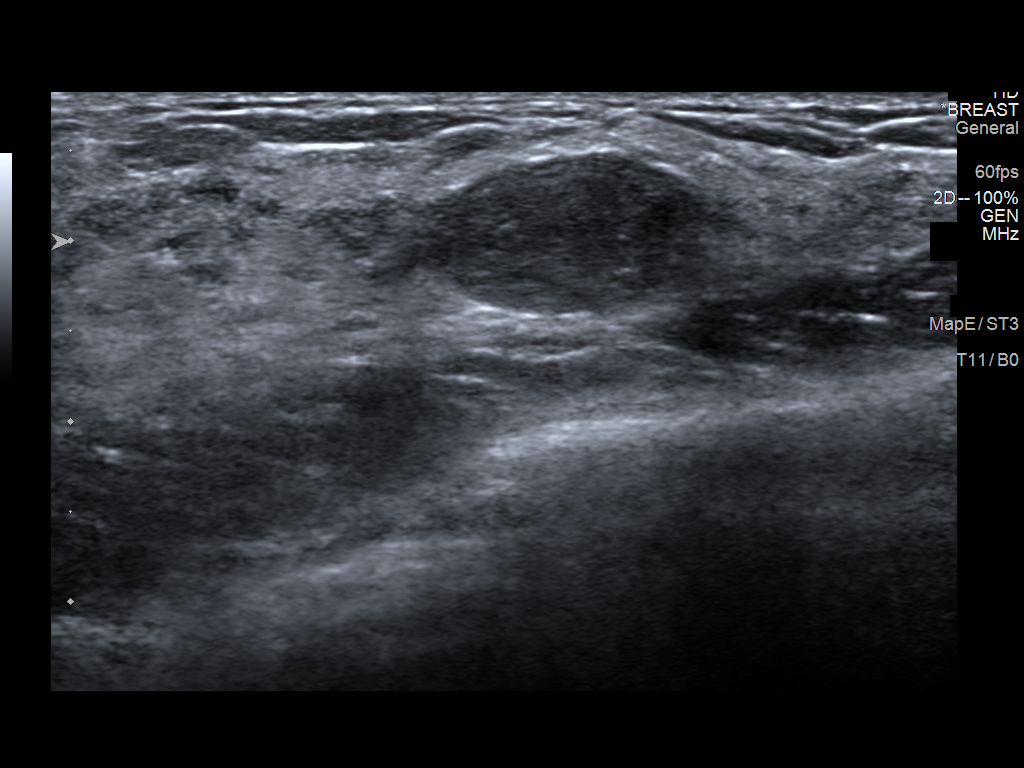
[im 2/4]
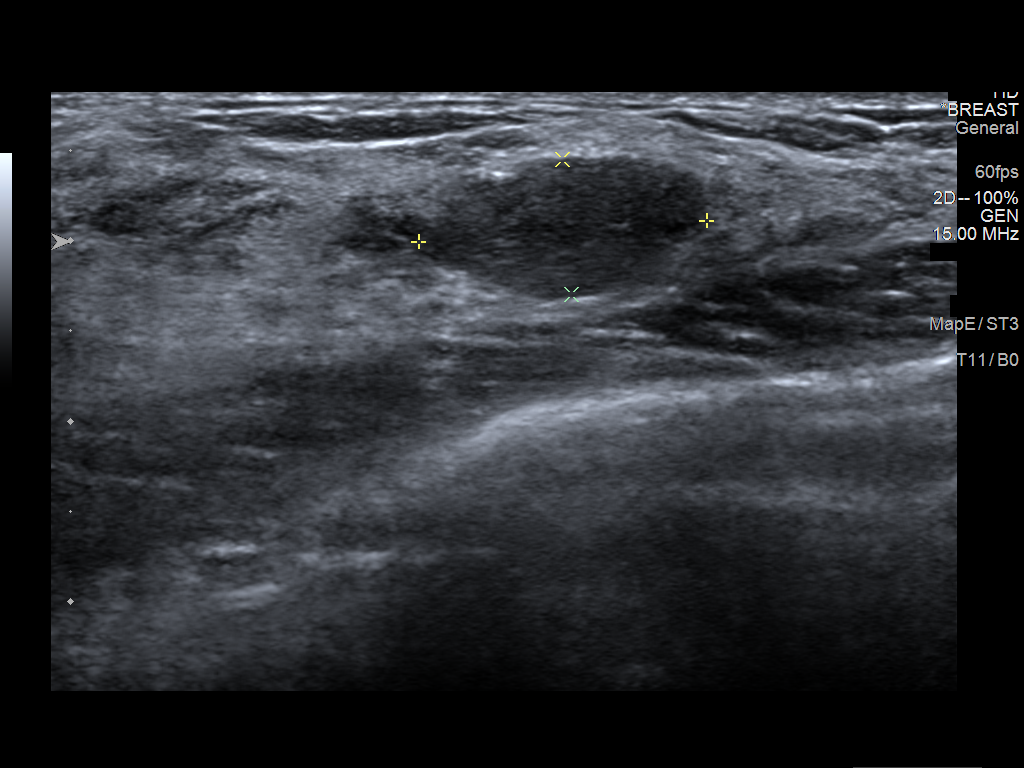
[im 3/4]
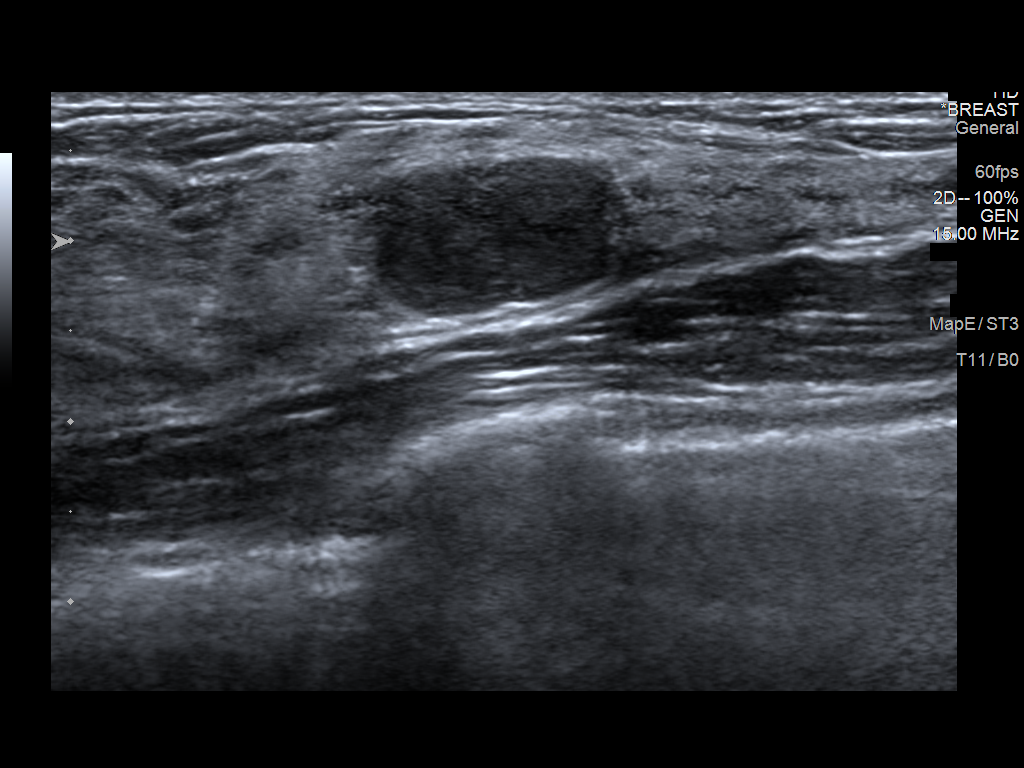
[im 4/4]
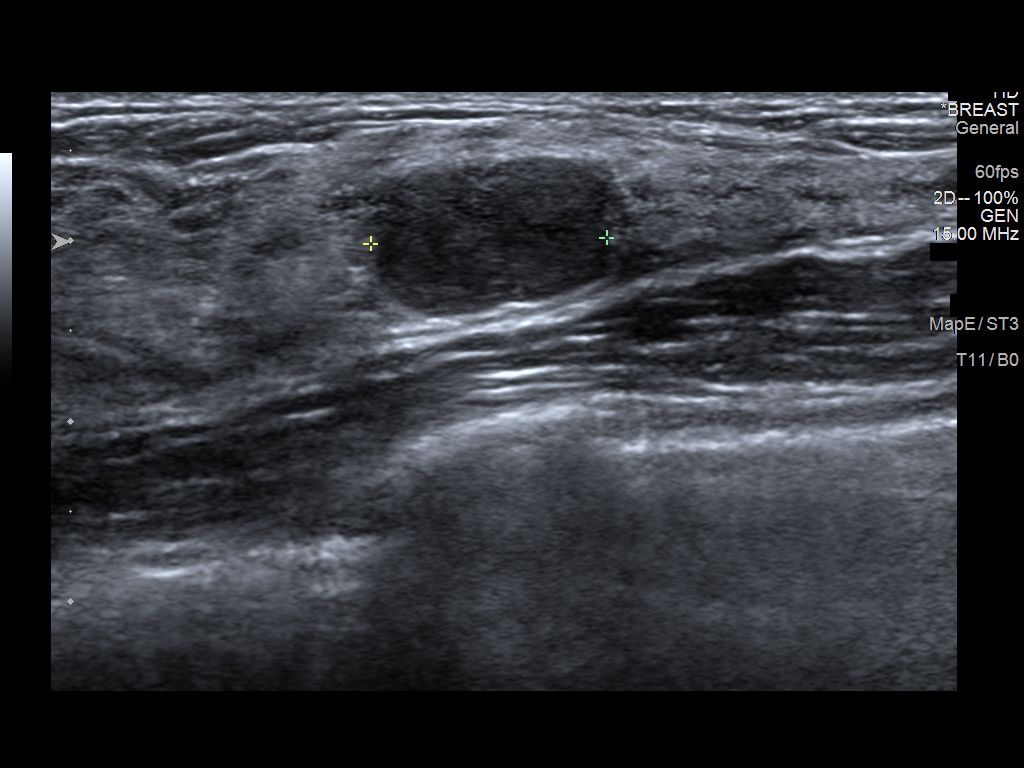

[4 of 4 positions shown; findings below may reference images not displayed]

ACR Breast Density Category d: The breast tissue is extremely dense,
which lowers the sensitivity of mammography.
FINDINGS: No suspicious mass, malignant type microcalcifications or distortion
detected in either breast.

Mammographic images were processed with CAD.

On physical exam, I palpate a discrete mass in the right breast at 1
o'clock 3 cm from the nipple.

Targeted ultrasound is performed, showing there is a
well-circumscribed hypoechoic mass in the right breast at 1 o'clock
3 cm from the nipple measuring 1.6 x 0.8 x 1.3 cm. Previously
08/29/2014 it measured 1.9 x 0.8 x 1.5 cm.
IMPRESSION: Stable probable fibroadenoma in the right breast.

RECOMMENDATION:
Bilateral diagnostic mammogram and right breast ultrasound in 1 year
is recommended to document 2 year stability. The importance of
self-breast examination was discussed with the patient.

I have discussed the findings and recommendations with the patient.
Results were also provided in writing at the conclusion of the
visit. If applicable, a reminder letter will be sent to the patient
regarding the next appointment.

BI-RADS CATEGORY  3: Probably benign.

## 2020-05-16 ENCOUNTER — Encounter: Attending: Physician Assistant

## 2020-08-18 LAB — AMB EXT HGBA1C
Hemoglobin A1C, External: 5.2 %
Hemoglobin A1c, External: 5.2 %

## 2020-10-20 ENCOUNTER — Encounter: Attending: Neurology

## 2021-03-29 ENCOUNTER — Inpatient Hospital Stay
Admit: 2021-03-29 | Discharge: 2021-03-30 | Discharge status: Against Medical Advice | Payer: MEDICARE | Source: Other Acute Inpatient Hospital | Attending: Psychiatry | Admitting: Psychiatry

## 2021-03-29 ENCOUNTER — Inpatient Hospital Stay
Admit: 2021-03-29 | Discharge: 2021-03-29 | Disposition: A | Discharge status: Other Acute Inpatient Hospital | Payer: MEDICARE | Attending: Emergency Medicine

## 2021-03-29 DIAGNOSIS — F332 Major depressive disorder, recurrent severe without psychotic features: Secondary | ICD-10-CM

## 2021-03-29 DIAGNOSIS — R45851 Suicidal ideations: Secondary | ICD-10-CM

## 2021-03-29 LAB — CBC WITH AUTO DIFFERENTIAL
Basophils %: 0 % (ref 0–1)
Basophils Absolute: 0 10*3/uL (ref 0.0–0.1)
Eosinophils %: 0 % (ref 0–7)
Eosinophils Absolute: 0 10*3/uL (ref 0.0–0.4)
Granulocyte Absolute Count: 0.1 10*3/uL — ABNORMAL HIGH (ref 0.00–0.04)
Hematocrit: 32.7 % — ABNORMAL LOW (ref 35.0–47.0)
Hemoglobin: 11.4 g/dL — ABNORMAL LOW (ref 11.5–16.0)
Immature Granulocytes: 1 % — ABNORMAL HIGH (ref 0.0–0.5)
Lymphocytes %: 15 % (ref 12–49)
Lymphocytes Absolute: 1 10*3/uL (ref 0.8–3.5)
MCH: 32.9 PG (ref 26.0–34.0)
MCHC: 34.9 g/dL (ref 30.0–36.5)
MCV: 94.5 FL (ref 80.0–99.0)
Monocytes %: 7 % (ref 5–13)
Monocytes Absolute: 0.5 10*3/uL (ref 0.0–1.0)
NRBC Absolute: 0 10*3/uL (ref 0.00–0.01)
Neutrophils %: 77 % — ABNORMAL HIGH (ref 32–75)
Neutrophils Absolute: 5.2 10*3/uL (ref 1.8–8.0)
Nucleated RBCs: 0 PER 100 WBC
Platelets: 264 10*3/uL (ref 150–400)
RBC: 3.46 M/uL — ABNORMAL LOW (ref 3.80–5.20)
RDW: 12.6 % (ref 11.5–14.5)
WBC: 6.8 10*3/uL (ref 3.6–11.0)

## 2021-03-29 LAB — COVID-19 WITH INFLUENZA A/B
Influenza A By PCR: NOT DETECTED
Influenza A by PCR: NOT DETECTED
Influenza B By PCR: NOT DETECTED
Influenza B by PCR: NOT DETECTED
SARS-CoV-2 by PCR: NOT DETECTED
SARS-CoV-2: NOT DETECTED

## 2021-03-29 LAB — COMPREHENSIVE METABOLIC PANEL
ALT: 48 U/L (ref 12–78)
AST: 60 U/L — ABNORMAL HIGH (ref 15–37)
Albumin/Globulin Ratio: 1 — ABNORMAL LOW (ref 1.1–2.2)
Albumin: 3.9 g/dL (ref 3.5–5.0)
Alkaline Phosphatase: 77 U/L (ref 45–117)
Anion Gap: 13 mmol/L (ref 5–15)
BUN: 16 MG/DL (ref 6–20)
Bun/Cre Ratio: 21 — ABNORMAL HIGH (ref 12–20)
CO2: 26 mmol/L (ref 21–32)
Calcium: 8.8 MG/DL (ref 8.5–10.1)
Chloride: 102 mmol/L (ref 97–108)
Creatinine: 0.75 MG/DL (ref 0.55–1.02)
EGFR IF NonAfrican American: >60 mL/min/{1.73_m2} (ref >60)
GFR African American: >60 mL/min/{1.73_m2} (ref >60)
Globulin: 3.8 g/dL (ref 2.0–4.0)
Glucose: 104 mg/dL — ABNORMAL HIGH (ref 65–100)
Potassium: 3.7 mmol/L (ref 3.5–5.1)
Sodium: 141 mmol/L (ref 136–145)
Total Bilirubin: 0.6 MG/DL (ref 0.2–1.0)
Total Protein: 7.7 g/dL (ref 6.4–8.2)

## 2021-03-29 LAB — COVID-19, RAPID: SARS-CoV-2, Rapid: NOT DETECTED

## 2021-03-29 LAB — LIPASE
Lipase: 105 U/L (ref 73–393)
Lipase: 105 U/L (ref 73–393)

## 2021-03-29 LAB — DRUG SCREEN, URINE
AMPHETAMINES: NEGATIVE
Amphetamine Screen, Urine: NEGATIVE
BARBITURATES: NEGATIVE
BENZODIAZEPINES: NEGATIVE
Barbiturate Screen, Urine: NEGATIVE
Benzodiazepine Screen, Urine: NEGATIVE
COCAINE: NEGATIVE
Cocaine Screen Urine: NEGATIVE
METHADONE: NEGATIVE
Methadone Screen, Urine: NEGATIVE
OPIATES: NEGATIVE
Opiate Screen, Urine: NEGATIVE
PCP Screen, Urine: NEGATIVE
PCP(PHENCYCLIDINE): NEGATIVE
THC (TH-CANNABINOL): NEGATIVE
THC Screen, Urine: NEGATIVE

## 2021-03-29 LAB — INFLUENZA A+B VIRAL AGS
Flu A Antigen: NEGATIVE
Influenza A Antigen: NEGATIVE
Influenza B Antigen: NEGATIVE
Influenza B Antigen: NEGATIVE

## 2021-03-29 LAB — ETHYL ALCOHOL
ALCOHOL(ETHYL),SERUM: <10 MG/DL (ref <10)
Ethyl Alcohol: <10 MG/DL (ref <10)

## 2021-03-29 LAB — ACETAMINOPHEN LEVEL: Acetaminophen Level: <2 ug/mL — ABNORMAL LOW (ref 10–30)

## 2021-03-29 LAB — SALICYLATE
Salicylate level: 1.7 MG/DL — ABNORMAL LOW (ref 2.8–20.0)
Salicylate: 1.7 MG/DL — ABNORMAL LOW (ref 2.8–20.0)

## 2021-03-29 LAB — HCG URINE, QL. - POC
HCG, Pregnancy, Urine, POC: NEGATIVE
Pregnancy test,urine (POC): NEGATIVE

## 2021-03-29 LAB — METABOLIC PANEL, COMPREHENSIVE
A-G Ratio: 1 — ABNORMAL LOW (ref 1.1–2.2)
ALT (SGPT): 48 U/L (ref 12–78)
AST (SGOT): 60 U/L — ABNORMAL HIGH (ref 15–37)
Albumin: 3.9 g/dL (ref 3.5–5.0)
Alk. phosphatase: 77 U/L (ref 45–117)
Anion gap: 13 mmol/L (ref 5–15)
BUN/Creatinine ratio: 21 — ABNORMAL HIGH (ref 12–20)
BUN: 16 MG/DL (ref 6–20)
Bilirubin, total: 0.6 MG/DL (ref 0.2–1.0)
CO2: 26 mmol/L (ref 21–32)
Calcium: 8.8 MG/DL (ref 8.5–10.1)
Chloride: 102 mmol/L (ref 97–108)
Creatinine: 0.75 MG/DL (ref 0.55–1.02)
GFR est AA: >60 mL/min/{1.73_m2} (ref >60)
GFR est non-AA: >60 mL/min/{1.73_m2} (ref >60)
Globulin: 3.8 g/dL (ref 2.0–4.0)
Glucose: 104 mg/dL — ABNORMAL HIGH (ref 65–100)
Potassium: 3.7 mmol/L (ref 3.5–5.1)
Protein, total: 7.7 g/dL (ref 6.4–8.2)
Sodium: 141 mmol/L (ref 136–145)

## 2021-03-29 LAB — CBC WITH AUTOMATED DIFF
ABS. BASOPHILS: 0 10*3/uL (ref 0.0–0.1)
ABS. EOSINOPHILS: 0 10*3/uL (ref 0.0–0.4)
ABS. IMM. GRANS.: 0.1 10*3/uL — ABNORMAL HIGH (ref 0.00–0.04)
ABS. LYMPHOCYTES: 1 10*3/uL (ref 0.8–3.5)
ABS. MONOCYTES: 0.5 10*3/uL (ref 0.0–1.0)
ABS. NEUTROPHILS: 5.2 10*3/uL (ref 1.8–8.0)
ABSOLUTE NRBC: 0 10*3/uL (ref 0.00–0.01)
BASOPHILS: 0 % (ref 0–1)
EOSINOPHILS: 0 % (ref 0–7)
HCT: 32.7 % — ABNORMAL LOW (ref 35.0–47.0)
HGB: 11.4 g/dL — ABNORMAL LOW (ref 11.5–16.0)
IMMATURE GRANULOCYTES: 1 % — ABNORMAL HIGH (ref 0.0–0.5)
LYMPHOCYTES: 15 % (ref 12–49)
MCH: 32.9 PG (ref 26.0–34.0)
MCHC: 34.9 g/dL (ref 30.0–36.5)
MCV: 94.5 FL (ref 80.0–99.0)
MONOCYTES: 7 % (ref 5–13)
NEUTROPHILS: 77 % — ABNORMAL HIGH (ref 32–75)
NRBC: 0 PER 100 WBC
PLATELET: 264 10*3/uL (ref 150–400)
RBC: 3.46 M/uL — ABNORMAL LOW (ref 3.80–5.20)
RDW: 12.6 % (ref 11.5–14.5)
WBC: 6.8 10*3/uL (ref 3.6–11.0)

## 2021-03-29 LAB — ACETAMINOPHEN: Acetaminophen level: <2 ug/mL — ABNORMAL LOW (ref 10–30)

## 2021-03-29 LAB — COVID-19 RAPID TEST: COVID-19 rapid test: NOT DETECTED

## 2021-03-29 MED ORDER — BENZTROPINE 1 MG TAB
1 mg | Freq: Two times a day (BID) | ORAL | Status: DC | PRN
Start: 2021-03-29 — End: 2021-03-30

## 2021-03-29 MED ORDER — HALOPERIDOL LACTATE 5 MG/ML IJ SOLN
5 mg/mL | Freq: Four times a day (QID) | INTRAMUSCULAR | Status: DC | PRN
Start: 2021-03-29 — End: 2021-03-30

## 2021-03-29 MED ORDER — ACETAMINOPHEN 325 MG TABLET
325 mg | ORAL | Status: DC | PRN
Start: 2021-03-29 — End: 2021-03-30

## 2021-03-29 MED ORDER — MAGNESIUM HYDROXIDE 400 MG/5 ML ORAL SUSP
400 mg/5 mL | Freq: Every day | ORAL | Status: DC | PRN
Start: 2021-03-29 — End: 2021-03-30

## 2021-03-29 MED ORDER — IBUPROFEN 400 MG TAB
400 mg | Freq: Three times a day (TID) | ORAL | Status: DC | PRN
Start: 2021-03-29 — End: 2021-03-30

## 2021-03-29 MED ORDER — TRAZODONE 50 MG TAB
50 mg | Freq: Every evening | ORAL | Status: DC | PRN
Start: 2021-03-29 — End: 2021-03-30

## 2021-03-29 MED ORDER — HYDROXYZINE 50 MG TAB
50 mg | Freq: Three times a day (TID) | ORAL | Status: DC | PRN
Start: 2021-03-29 — End: 2021-03-30
  Administered 2021-03-29: 23:00:00 via ORAL

## 2021-03-29 MED ORDER — ACETAMINOPHEN 500 MG TAB
500 mg | ORAL | Status: AC
Start: 2021-03-29 — End: 2021-03-29
  Administered 2021-03-29: 13:00:00 via ORAL

## 2021-03-29 MED ORDER — CALCIUM CARBONATE 200 MG (500 MG) CHEWABLE TAB
200 mg calcium (500 mg) | Freq: Once | ORAL | Status: AC
Start: 2021-03-29 — End: 2021-03-29
  Administered 2021-03-30: 01:00:00 via ORAL

## 2021-03-29 MED ORDER — ONDANSETRON (PF) 4 MG/2 ML INJECTION
4 mg/2 mL | INTRAMUSCULAR | Status: AC | PRN
Start: 2021-03-29 — End: 2021-03-29
  Administered 2021-03-29 (×2): via INTRAVENOUS

## 2021-03-29 MED ORDER — DIPHENHYDRAMINE HCL 50 MG/ML IJ SOLN
50 mg/mL | Freq: Two times a day (BID) | INTRAMUSCULAR | Status: DC | PRN
Start: 2021-03-29 — End: 2021-03-30

## 2021-03-29 MED ORDER — OLANZAPINE 5 MG TAB
5 mg | Freq: Four times a day (QID) | ORAL | Status: DC | PRN
Start: 2021-03-29 — End: 2021-03-30

## 2021-03-29 MED ORDER — CALCITRIOL 0.25 MCG CAP
0.25 mcg | Freq: Once | ORAL | Status: AC
Start: 2021-03-29 — End: 2021-03-29
  Administered 2021-03-30: 01:00:00 via ORAL

## 2021-03-29 MED FILL — ONDANSETRON (PF) 4 MG/2 ML INJECTION: 4 mg/2 mL | INTRAMUSCULAR | Qty: 2

## 2021-03-29 MED FILL — CALCITRIOL 0.25 MCG CAP: 0.25 mcg | ORAL | Qty: 1

## 2021-03-29 MED FILL — ACETAMINOPHEN 500 MG TAB: 500 mg | ORAL | Qty: 2

## 2021-03-29 MED FILL — HYDROXYZINE 50 MG TAB: 50 mg | ORAL | Qty: 1

## 2021-03-29 NOTE — ED Provider Notes (Signed)
The history is provided by the patient.   Vomiting   This is a new problem. The current episode started 1 to 2 hours ago. The problem has been resolved. There has been no fever. Associated symptoms include chills, headaches and headaches. Pertinent negatives include no fever, no abdominal pain and no diarrhea. 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   Nausea   This is a new problem. The current episode started 1 to 2 hours ago. The problem has not changed since onset.Associated symptoms include chills, headaches and headaches. Pertinent negatives include no fever, no abdominal pain and no diarrhea. 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   Chills   Associated symptoms include vomiting and headaches. Pertinent negatives include no diarrhea.   Headache   Associated symptoms include nausea and vomiting. Pertinent negatives include no fever.      Past Medical History:   Diagnosis Date   ??? Hypoparathyroidism (HCC)    ??? Thyroid ca [facility])        Past Surgical History:   Procedure Laterality Date   ??? HX PARATHYROIDECTOMY     ??? HX THYROIDECTOMY           History reviewed. No pertinent family history.    Social History     Socioeconomic History   ??? Marital status: SINGLE     Spouse name: Not on file   ??? Number of children: Not on file   ??? Years of education: Not on file   ??? Highest education level: Not on file   Occupational History   ??? Not on file   Tobacco Use   ??? Smoking status: Never   ??? Smokeless tobacco: Never   Substance and Sexual Activity   ??? Alcohol use: Yes   ??? Drug use: Never   ??? Sexual activity: Not on file   Other Topics Concern   ??? Not on file   Social History Narrative   ??? Not on file     Social Determinants of Health     Financial Resource Strain: Not on file   Food Insecurity: Not on file   Transportation Needs: Not on file   Physical Activity: Not on file   Stress: Not on file   Social Connections: Not on file    Intimate Partner Violence: Not on file   Housing Stability: Not on file           ALLERGIES: Patient has no known allergies.    Review of Systems   Constitutional:  Positive for chills. Negative for fever.   Gastrointestinal:  Positive for nausea and vomiting. Negative for abdominal pain and diarrhea.   Neurological:  Positive for headaches.   All other systems reviewed and are negative.    Vitals:    03/29/21 0201   BP: 128/86   Pulse: 80   Resp: 20   Temp: 97.8 ??F (36.6 ??C)   SpO2: 100%   Weight: 59.9 kg (132 lb)   Height: 5\' 7"  (1.702 m)            Physical Exam  Vitals and nursing note reviewed.   Constitutional:       General: She is not in acute distress.     Appearance: She is well-developed.   HENT:      Head: Normocephalic and atraumatic.   Eyes:      Extraocular Movements: Extraocular movements intact.      Conjunctiva/sclera: Conjunctivae normal.  Pupils: Pupils are equal, round, and reactive to light.   Cardiovascular:      Rate and Rhythm: Normal rate and regular rhythm.      Heart sounds: Normal heart sounds.   Pulmonary:      Effort: Pulmonary effort is normal. No respiratory distress.      Breath sounds: Normal breath sounds.   Abdominal:      General: There is no distension.   Musculoskeletal:         General: No deformity. Normal range of motion.      Cervical back: Neck supple.   Skin:     General: Skin is warm and dry.   Neurological:      Mental Status: She is alert.      Cranial Nerves: No cranial nerve deficit.   Psychiatric:         Attention and Perception: She is inattentive.         Mood and Affect: Mood is depressed. Affect is flat.         Speech: Speech is tangential.         Behavior: Behavior is withdrawn.         Thought Content: Thought content is not paranoid or delusional. Thought content includes suicidal (chronically) ideation. Thought content does not include homicidal ideation. Thought content does not include suicidal plan.        MDM    ED Course as of 03/29/21 0640    Thu Mar 29, 2021   0255 EKG 0252: Rate 71, Normal sinus rhythm, No ST segment or T wave abnormalities. Normal EKG.    [DK]      ED Course User Index  [DK] Truett Mainland, MD     40 year old female presents with chief complaint of vomiting episodes that occurred at home.  She felt lightheaded and weak when it occurred and called 911 who brought her here.  On arrival it is clear that the patient called for psychiatric distress and complains of PTSD, the recent loss of her grandfather, passive suicidal ideation, ongoing substance abuse of alcohol with the last drink 2 to 3 hours prior to arrival here and worry that she might need to enter detox and rehabilitation.  She sees a psychiatrist in [place] and last saw them 2 weeks ago.  She is not vomiting on arrival here, is hemodynamically stable and well-appearing.  She complains of progressively worsening depression, lack of sleep and loss of support system that she used to have when she lived in [place].  She recently got back from a vacation at the beach where she also had sun exposure.  Lab screening performed for medical clearance and she will discuss her mental health issues with [facility] counselor by request.    The patient has not vomited during her emergency department course, she has no evidence of withdrawal syndrome.  Mental health team to seek inpatient psychiatric placement for ongoing anxiety, depression, and suicidal ideation. MEDICALLY CLEAR FOR TRANSFER OR PSYCHIATRIC ADMISSION.     Procedures

## 2021-03-29 NOTE — ED Notes (Signed)
Patient asked if she could take her home medications that she brought with her (buspirone, Calcitrol, calcium tablets, and lamotrigine). Patient educated that a medication reconciliation will be completed by the providers and told not to take the medications she has with her. Medications removed from her room. MD notified.

## 2021-03-29 NOTE — ED Notes (Signed)
Pt c/o coming back from the beach at 5pm and starting with n/v, chills, and HA.

## 2021-03-29 NOTE — Behavioral Health Treatment Team (Signed)
Primary Nurse Henderson Newcomer, RN and Connye Burkitt RN, RN performed a dual skin assessment on this patient No impairment noted  Braden score is 23

## 2021-03-29 NOTE — Behavioral Health Treatment Team (Signed)
 TRANSFER - IN REPORT:    Verbal report received from [facility]) on Angel Wilkerson  being received from [facility](unit) for routine progression of care      Report consisted of patient's Situation, Background, Assessment and   Recommendations(SBAR).     Information from the following report(s) SBAR, Kardex, and ED Summary was reviewed with the receiving nurse.    Opportunity for questions and clarification was provided.      Assessment completed upon patient's arrival to unit and care assumed.

## 2021-03-29 NOTE — ED Notes (Signed)
Dr. Clydene Pugh at bedside to evaluate

## 2021-03-29 NOTE — ED Notes (Signed)
 TRANSFER - OUT REPORT:    Verbal report given to Melissa, RN(name) on Angel Wilkerson  being transferred to [facility](unit) for routine progression of care       Report consisted of patient's Situation, Background, Assessment and   Recommendations(SBAR).     Information from the following report(s) ED Summary was reviewed with the receiving nurse.    Lines:   Peripheral IV 03/29/21 Right Antecubital (Active)        Opportunity for questions and clarification was provided.

## 2021-03-29 NOTE — ED Notes (Signed)
Pt resting quietly in bed.  States she is unable to sleep.  MD made aware.  Awaiting bed placement.

## 2021-03-29 NOTE — ED Notes (Signed)
Patient transported to [facility] via [facility]. Patient belongings given to [facility] personal in a patient belongings bag with patient label.

## 2021-03-29 NOTE — ED Notes (Signed)
Bedside and Verbal shift change report given to Samara Deist, Charity fundraiser (oncoming nurse) by Lupita Leash, RN (offgoing nurse). Report included the following information ED Summary.

## 2021-03-30 LAB — EKG 12-LEAD
Atrial Rate: 71 {beats}/min
Diagnosis: NORMAL
P Axis: 67 degrees
P-R Interval: 136 ms
Q-T Interval: 438 ms
QRS Duration: 92 ms
QTc Calculation (Bazett): 475 ms
R Axis: 47 degrees
T Axis: 49 degrees
Ventricular Rate: 71 {beats}/min

## 2021-03-30 LAB — EKG, 12 LEAD, INITIAL
Atrial Rate: 71 {beats}/min
Calculated P Axis: 67 degrees
Calculated R Axis: 47 degrees
Calculated T Axis: 49 degrees
Diagnosis: NORMAL
P-R Interval: 136 ms
Q-T Interval: 438 ms
QRS Duration: 92 ms
QTC Calculation (Bezet): 475 ms
Ventricular Rate: 71 {beats}/min

## 2021-03-30 MED ORDER — LEVOTHYROXINE 50 MCG TAB
50 mcg | Freq: Every day | ORAL | Status: DC
Start: 2021-03-30 — End: 2021-03-30

## 2021-03-30 MED ORDER — PRAZOSIN 1 MG CAP
1 mg | Freq: Every evening | ORAL | Status: DC
Start: 2021-03-30 — End: 2021-03-30

## 2021-03-30 MED ORDER — CALCITRIOL 0.25 MCG CAP
0.25 mcg | Freq: Every day | ORAL | Status: DC
Start: 2021-03-30 — End: 2021-03-30

## 2021-03-30 MED ORDER — CALCIUM CARBONATE 200 MG (500 MG) CHEWABLE TAB
200 mg calcium (500 mg) | Freq: Every day | ORAL | Status: DC
Start: 2021-03-30 — End: 2021-03-30

## 2021-03-30 MED ORDER — CLONAZEPAM 0.5 MG TAB
0.5 mg | Freq: Every day | ORAL | Status: DC
Start: 2021-03-30 — End: 2021-03-30

## 2021-03-30 MED ORDER — LAMOTRIGINE 25 MG TAB
25 mg | Freq: Every day | ORAL | Status: DC
Start: 2021-03-30 — End: 2021-03-30

## 2021-03-30 MED ORDER — FLUOXETINE 10 MG CAP
10 mg | Freq: Every day | ORAL | Status: DC
Start: 2021-03-30 — End: 2021-03-30

## 2021-03-30 MED FILL — CALCIUM CARBONATE 200 MG (500 MG) CHEWABLE TAB: 200 mg calcium (500 mg) | ORAL | Qty: 1

## 2021-03-30 MED FILL — CALCITRIOL 0.25 MCG CAP: 0.25 mcg | ORAL | Qty: 3

## 2021-03-30 NOTE — Behavioral Health Treatment Team (Signed)
Admission reviewed for medical necessity. Will follow with care mngt.

## 2021-03-30 NOTE — Progress Notes (Signed)
 Problem: Anxiety-Behavioral Health (Adult/Pediatric)  Goal: *STG: Participates in treatment plan  Outcome: Progressing Towards Goal  Note: Denies SI immediately upon meeting staff. Labile mood and affect, describes feeling freaking out due to lack of calcium medications from her PTA medication. States she is confused at why she is on a psychiatric unit and was under the impression during her ED assessment they agreed she was not suicidal but was medically sick she would get a medical bed to address her calcium. Noted during H&P assessment in tx team pt is interruptive, defensive and deflective. When asked about a bipolar diagnosis and her lamictal pt states I don't have bipolar, people don't always have bipolar every day just sometime. Noted flight of ideas and she frequently returning to topic of disability and her feeling overwhelmed with dealing with anxiety and her past medical diagnosis. During tx team pt never stated she was suicidal, frequently mentioned future plans and upcoming events such as appointments and events. Pt did not verbalize understanding or demonstrates ability to retain medication education provided by tx team. Will offer written information on medications and when they are available.     1352: up to nursing requesting discharge. Education provided on her current voluntary status and concerns regarding sudden unplanned discharged. Pt offer staff to talk with her friend to discuss safety planning and after care. Pt currently attempting to reach friend via phone. Tx team made aware. Will update as soon as her chosen support person is reached.     1355: [facility] called and discussed TDO evaluation. They stated they would not assess this patient due to her denying suicidal thoughts, lack of plan and no self harming behaviors. Dr. Zanoun aware and order for AMA obtained.   Goal: *STG: Seeks staff when feelings of anxiety and fear arise  Outcome: Progressing Towards Goal  Goal: *STG:  Attends activities and groups  Outcome: Progressing Towards Goal  Goal: *STG: Demonstrates effective anxiety reduction strategies  Outcome: Progressing Towards Goal  Goal: *LTG:  Verbalizes understanding of personal adaptive level of functioning  Outcome: Progressing Towards Goal  Goal: Interventions  Outcome: Progressing Towards Goal       [facility]  Master Treatment Plan for Angel Wilkerson    Date Treatment Plan Initiated: 03/30/21    Treatment Plan Modalities:  Type of Modality Amount  (x minutes) Frequency (x/week) Duration (x days) Name of Responsible Staff   Community & wrap-up meetings to encourage peer interactions 15 7 1  Wells Sheen     Group psychotherapy to assist in building coping skills and internal controls 60 7 1 Izetta Bishop MSW   Therapeutic activity groups to build coping skills 60 7 1 Izetta Bishop MSW   Psychoeducation in group setting to address:   Medication education   15 7 1  Royce Mainland PharmD   Coping skills   30 3 1  Elouise Drew, Selinda Nell Harlene Glendia   Relaxation techniques         Symptom management         Discharge planning   60 2 1 Ellouise Curry, Katie   Spirituality    60 2 1 Chaplain    NAMI   60 1 1 volunteer   Conservation officer, historic buildings   Physician medication management   15 7 1  Dr. Zanoun   Family meeting/discharge planning   15 2 1  Ellouise Curry
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
## 2021-03-30 NOTE — Progress Notes (Signed)
Problem: Anxiety-Behavioral Health (Adult/Pediatric)  Goal: *STG: Remains safe in hospital  Outcome: Progressing Towards Goal  Goal: *STG: Seeks staff when feelings of anxiety and fear arise  Outcome: Progressing Towards Goal  Goal: *STG: Attends activities and groups  Outcome: Progressing Towards Goal     Patient is resting quietly in bed with eyes closed.  Appears to be asleep.  No respiratory distress noted.  15 minutes safety monitoring continues.

## 2021-03-30 NOTE — Progress Notes (Signed)
Angel Wilkerson was not available when I made rounds.  Father Providence Lanius
